# Patient Record
Sex: Male | Born: 1970 | Race: Black or African American | Hispanic: No | Marital: Married | Smoking: Current every day smoker
Health system: Southern US, Community
[De-identification: ages and names within clinical notes are randomized; demographics above are authoritative.]

## PROBLEM LIST (undated history)

## (undated) DIAGNOSIS — E785 Hyperlipidemia, unspecified: Secondary | ICD-10-CM

## (undated) DIAGNOSIS — E119 Type 2 diabetes mellitus without complications: Secondary | ICD-10-CM

## (undated) DIAGNOSIS — I10 Essential (primary) hypertension: Secondary | ICD-10-CM

## (undated) HISTORY — PX: OTHER SURGICAL HISTORY: SHX169

## (undated) HISTORY — DX: Type 2 diabetes mellitus without complications: E11.9

## (undated) HISTORY — DX: Essential (primary) hypertension: I10

## (undated) HISTORY — DX: Hyperlipidemia, unspecified: E78.5

---

## 2012-03-05 ENCOUNTER — Encounter (INDEPENDENT_AMBULATORY_CARE_PROVIDER_SITE_OTHER): Payer: Self-pay | Admitting: *Deleted

## 2012-03-18 ENCOUNTER — Ambulatory Visit (INDEPENDENT_AMBULATORY_CARE_PROVIDER_SITE_OTHER): Payer: 59 | Admitting: Internal Medicine

## 2012-03-18 ENCOUNTER — Telehealth (INDEPENDENT_AMBULATORY_CARE_PROVIDER_SITE_OTHER): Payer: Self-pay | Admitting: *Deleted

## 2012-03-18 ENCOUNTER — Other Ambulatory Visit (INDEPENDENT_AMBULATORY_CARE_PROVIDER_SITE_OTHER): Payer: Self-pay | Admitting: *Deleted

## 2012-03-18 ENCOUNTER — Encounter (INDEPENDENT_AMBULATORY_CARE_PROVIDER_SITE_OTHER): Payer: Self-pay | Admitting: Internal Medicine

## 2012-03-18 VITALS — BP 120/66 | HR 84 | Temp 97.8°F | Ht 65.0 in | Wt 169.8 lb

## 2012-03-18 DIAGNOSIS — K625 Hemorrhage of anus and rectum: Secondary | ICD-10-CM

## 2012-03-18 DIAGNOSIS — Z800 Family history of malignant neoplasm of digestive organs: Secondary | ICD-10-CM

## 2012-03-18 DIAGNOSIS — K921 Melena: Secondary | ICD-10-CM

## 2012-03-18 DIAGNOSIS — Z1211 Encounter for screening for malignant neoplasm of colon: Secondary | ICD-10-CM

## 2012-03-18 NOTE — Patient Instructions (Addendum)
Colonoscopy.  The risks and benefits such as perforation, bleeding, and infection were reviewed with the patient and is agreeable. 

## 2012-03-18 NOTE — Progress Notes (Signed)
Subjective:     Patient ID: Justin [name], male   DOB: 1971/01/22, 41 y.o.   MRN: [id number]  HPI Justin [name] is a 41 yr old male referred to our office for blood in his stool.  About 1 1/2 months ago he saw blood after he wiped.  The end of July he saw blood when he wiped. His stools were not hard when this occurred. Stools are light brown in color. No melena.  No pain with BMs.  He usually has 5-6 BMs a day which is normal for him. Appetite is good.  He has lost about 6-7 pounds after starting the new diabetic medication.      01/20/2012 H and H 14.8 and 44.0, MCV 87.1  Grandfather had colon cancer at age 92.  Is on another diabetic medications but he did not bring. Will call back medications.  Review of Systems see hpi Current Outpatient Prescriptions  Medication Sig Dispense Refill  . benazepril (LOTENSIN) 40 MG tablet Take 40 mg by mouth daily.      . metFORMIN (GLUMETZA) 1000 MG (MOD) 24 hr tablet Take 1,000 mg by mouth 2 (two) times daily with a meal.       Past Medical History  Diagnosis Date  . Hypertension     x 5 yrs  . Diabetes mellitus     Type 2 x 9 yrs.   History reviewed. No pertinent past surgical history.History reviewed. No pertinent past surgical history. History   Social History  . Marital Status: Married    Spouse Name: N/A    Number of Children: N/A  . Years of Education: N/A   Occupational History  . Not on file.   Social History Main Topics  . Smoking status: Current Everyday Smoker  . Smokeless tobacco: Not on file   Comment: Cigar a day  . Alcohol Use: Yes     6 pack a week  . Drug Use: No  . Sexually Active: Not on file   Other Topics Concern  . Not on file   Social History Narrative  . No narrative on file   Family Status  Relation Status Death Age  . Mother Alive     hypertension  . Father Alive     Diverticulitis  . Sister Alive     good health   Allergies  Allergen Reactions  . Codeine     Hives, itching            Objective:   Physical Exam Filed Vitals:   03/18/12 1527  Height: 5\' 5"  (1.651 m)  Weight: 169 lb 12.8 oz (77.021 kg)   Alert and oriented. Skin warm and dry. Oral mucosa is moist.   . Sclera anicteric, conjunctivae is pink. Thyroid not enlarged. No cervical lymphadenopathy. Lungs clear. Heart regular rate and rhythm.  Abdomen is soft. Bowel sounds are positive. Abdomen is soft but firm to the touch  . No abdominal masses felt. No tenderness.  No edema to lower extremities. Patient is alert and oriented.  Stool brown and guaiac positive.     Assessment:   Hx of rectal bleeding per patient.  Heme positive stool in office today. Colonic neoplasm, AVM, polyp needs to be ruled out. Family hx of colon cancer (Grandfather)    Plan:   Colonscopy.

## 2012-03-18 NOTE — Telephone Encounter (Signed)
Patient need movi prep 

## 2012-03-20 MED ORDER — PEG-KCL-NACL-NASULF-NA ASC-C 100 G PO SOLR: 1.0000 | Freq: Once | ORAL | Status: DC

## 2012-04-03 ENCOUNTER — Encounter (INDEPENDENT_AMBULATORY_CARE_PROVIDER_SITE_OTHER): Payer: Self-pay

## 2012-04-06 ENCOUNTER — Encounter (HOSPITAL_COMMUNITY): Payer: Self-pay | Admitting: Pharmacy Technician

## 2012-04-10 ENCOUNTER — Ambulatory Visit (HOSPITAL_COMMUNITY)
Admission: RE | Admit: 2012-04-10 | Discharge: 2012-04-10 | Disposition: A | Discharge status: Home / Self Care | Payer: 59 | Source: Ambulatory Visit | Attending: Internal Medicine | Admitting: Internal Medicine

## 2012-04-10 ENCOUNTER — Encounter (HOSPITAL_COMMUNITY): Payer: Self-pay | Admitting: *Deleted

## 2012-04-10 ENCOUNTER — Encounter (HOSPITAL_COMMUNITY)
Admission: RE | Disposition: A | Discharge status: Home / Self Care | Payer: Self-pay | Source: Ambulatory Visit | Attending: Internal Medicine

## 2012-04-10 DIAGNOSIS — K5730 Diverticulosis of large intestine without perforation or abscess without bleeding: Secondary | ICD-10-CM | POA: Insufficient documentation

## 2012-04-10 DIAGNOSIS — D126 Benign neoplasm of colon, unspecified: Secondary | ICD-10-CM | POA: Insufficient documentation

## 2012-04-10 DIAGNOSIS — K625 Hemorrhage of anus and rectum: Secondary | ICD-10-CM

## 2012-04-10 DIAGNOSIS — K644 Residual hemorrhoidal skin tags: Secondary | ICD-10-CM | POA: Insufficient documentation

## 2012-04-10 DIAGNOSIS — D128 Benign neoplasm of rectum: Secondary | ICD-10-CM

## 2012-04-10 DIAGNOSIS — Z01812 Encounter for preprocedural laboratory examination: Secondary | ICD-10-CM | POA: Insufficient documentation

## 2012-04-10 DIAGNOSIS — K921 Melena: Secondary | ICD-10-CM

## 2012-04-10 DIAGNOSIS — E119 Type 2 diabetes mellitus without complications: Secondary | ICD-10-CM | POA: Insufficient documentation

## 2012-04-10 DIAGNOSIS — K6389 Other specified diseases of intestine: Secondary | ICD-10-CM

## 2012-04-10 DIAGNOSIS — D129 Benign neoplasm of anus and anal canal: Secondary | ICD-10-CM

## 2012-04-10 DIAGNOSIS — Z800 Family history of malignant neoplasm of digestive organs: Secondary | ICD-10-CM | POA: Insufficient documentation

## 2012-04-10 DIAGNOSIS — R195 Other fecal abnormalities: Secondary | ICD-10-CM

## 2012-04-10 DIAGNOSIS — I10 Essential (primary) hypertension: Secondary | ICD-10-CM | POA: Insufficient documentation

## 2012-04-10 HISTORY — PX: COLONOSCOPY: SHX5424

## 2012-04-10 LAB — GLUCOSE, CAPILLARY: Glucose-Capillary: 157 mg/dL — ABNORMAL HIGH (ref 70–99)

## 2012-04-10 SURGERY — COLONOSCOPY
Anesthesia: Moderate Sedation

## 2012-04-10 MED ORDER — SODIUM CHLORIDE 0.45 % IV SOLN
INTRAVENOUS | Status: DC
  Administered 2012-04-10: 12:00:00 via INTRAVENOUS

## 2012-04-10 MED ORDER — MIDAZOLAM HCL 5 MG/5ML IJ SOLN
INTRAMUSCULAR | Status: DC | PRN
  Administered 2012-04-10 (×2): 2 mg via INTRAVENOUS
  Administered 2012-04-10: 1 mg via INTRAVENOUS

## 2012-04-10 MED ORDER — MIDAZOLAM HCL 5 MG/5ML IJ SOLN
INTRAMUSCULAR | Status: AC
  Filled 2012-04-10: qty 10

## 2012-04-10 MED ORDER — HYDROCORTISONE ACETATE 25 MG RE SUPP: 25.0000 mg | Freq: Every day | RECTAL | Status: AC

## 2012-04-10 MED ORDER — MEPERIDINE HCL 50 MG/ML IJ SOLN
INTRAMUSCULAR | Status: DC | PRN
  Administered 2012-04-10 (×2): 25 mg via INTRAVENOUS

## 2012-04-10 MED ORDER — MEPERIDINE HCL 50 MG/ML IJ SOLN
INTRAMUSCULAR | Status: AC
  Filled 2012-04-10: qty 1

## 2012-04-10 MED ORDER — STERILE WATER FOR IRRIGATION IR SOLN
Status: DC | PRN
  Administered 2012-04-10: 12:00:00

## 2012-04-10 NOTE — Op Note (Signed)
COLONOSCOPY PROCEDURE REPORT  PATIENT:  Justin [name]  MR#:  [id number] Birthdate:  Apr 25, 1971, 41 y.o., male Endoscopist:  Dr. Malissa Hippo, MD Referred By:  Dr. Oley Balm. Margo Common, MD Procedure Date: 04/10/2012  Procedure:   Colonoscopy  Indications:  Patient is 41 year old African American male with intermittent hematochezia was also noted to have heme positive stools. He denies diarrhea constipation or abdominal pain. Family history is significant for colon carcinoma and maternal grandmother at age 26 and father had colonic polyps.  Informed Consent:  The procedure and risks were reviewed with the patient and informed consent was obtained.  Medications:  Demerol 50 mg IV Versed 5 mg IV  Description of procedure:  After a digital rectal exam was performed, that colonoscope was advanced from the anus through the rectum and colon to the area of the cecum, ileocecal valve and appendiceal orifice. The cecum was deeply intubated. These structures were well-seen and photographed for the record. From the level of the cecum and ileocecal valve, the scope was slowly and cautiously withdrawn. The mucosal surfaces were carefully surveyed utilizing scope tip to flexion to facilitate fold flattening as needed. The scope was pulled down into the rectum where a thorough exam including retroflexion was performed. Terminal ileum was also examined.  Findings:   Prep excellent. Normal terminal ileal mucosa. Small cecal polyp ablated via cold biopsy. Another small rectal polyp ablated via cold biopsy. Few small diverticula at sigmoid colon. Anal papillae and hemorrhoids below the dentate line.  Therapeutic/Diagnostic Maneuvers Performed:  See above  Complications:  None  Cecal Withdrawal Time:  16 minutes  Impression:  Normal terminal ileum. Mild sigmoid colon diverticulosis. 2 small polyps ablated via cold biopsied 1 from cecum and the second one from rectum. External hemorrhoids and small anal  papillae.  Recommendations:  Standard instructions given. Anusol-HC suppository 1 PR daily at bedtime for 2 weeks. I will contact patient with biopsy results and further recommendations.  Justin [name]  04/10/2012 12:43 PM  CC: Dr. Louie Boston, MD & Dr. Bonnetta Barry ref. provider found

## 2012-04-10 NOTE — H&P (Signed)
Justin [name] is an 41 y.o. male.   Chief Complaint: Patient is here for colonoscopy. HPI: Patient is 41 year old African male who has noted intermittent hematochezia the last 2 months. He denies abdominal pain diarrhea or constipation. He has a good appetite. His hemoglobin is normal. He was seen in the office 2 weeks ago and his stool was heme positive. Family history is positive for colon carcinoma in maternal grandmother was diagnosed at age 32 and died within a year. His father has had colonic polyps and had surgery for diverticulitis  Past Medical History  Diagnosis Date  . Hypertension     x 5 yrs  . Diabetes mellitus     Type 2 x 9 yrs.    Past Surgical History  Procedure Date  .  rt knee arthroscopy     History reviewed. No pertinent family history. Social History:  reports that he has been smoking.  He does not have any smokeless tobacco history on file. He reports that he drinks alcohol. He reports that he does not use illicit drugs.  Allergies:  Allergies  Allergen Reactions  . Codeine     Hives, itching     Medications Prior to Admission  Medication Sig Dispense Refill  . benazepril (LOTENSIN) 20 MG tablet Take 20 mg by mouth 2 (two) times daily.      . Canagliflozin (INVOKANA) 100 MG TABS Take 100 mg by mouth daily.       . metformin (FORTAMET) 500 MG (OSM) 24 hr tablet Take 500 mg by mouth 3 (three) times daily.      . peg 3350 powder (MOVIPREP) 100 G SOLR Take 1 kit (100 g total) by mouth once.  1 kit  0    Results for orders placed during the hospital encounter of 04/10/12 (from the past 48 hour(s))  GLUCOSE, CAPILLARY     Status: Abnormal   Collection Time   04/10/12 11:46 AM      Component Value Range Comment   Glucose-Capillary 157 (*) 70 - 99 mg/dL    No results found.  ROS  Blood pressure 134/91, pulse 87, temperature 98 F (36.7 C), resp. rate 18, height 5\' 5"  (1.651 m), weight 158 lb (71.668 kg), SpO2 95.00%. Physical Exam  Constitutional: He  appears well-developed and well-nourished.  HENT:  Mouth/Throat: Oropharynx is clear and moist.  Eyes: Conjunctivae normal are normal. No scleral icterus.  Neck: No thyromegaly present.  Cardiovascular: Normal rate, regular rhythm and normal heart sounds.   No murmur heard. Respiratory: Effort normal and breath sounds normal.  GI: Soft. Bowel sounds are normal. He exhibits no distension and no mass. There is no tenderness.  Musculoskeletal: He exhibits no edema.  Lymphadenopathy:    He has no cervical adenopathy.  Neurological: He is alert.  Skin: Skin is warm and dry.     Assessment/Plan Hematochezia and heme-positive stools. Diagnostic colonoscopy.  Justin,NAJEEB [name] 04/10/2012, 12:08 PM

## 2012-04-15 ENCOUNTER — Encounter (HOSPITAL_COMMUNITY): Payer: Self-pay | Admitting: Internal Medicine

## 2012-04-20 ENCOUNTER — Encounter (INDEPENDENT_AMBULATORY_CARE_PROVIDER_SITE_OTHER): Payer: Self-pay | Admitting: *Deleted

## 2015-05-04 ENCOUNTER — Other Ambulatory Visit: Payer: Self-pay

## 2015-05-04 MED ORDER — METFORMIN HCL 1000 MG PO TABS: 1000.0000 mg | ORAL_TABLET | Freq: Two times a day (BID) | ORAL | Status: DC

## 2015-06-07 ENCOUNTER — Ambulatory Visit (INDEPENDENT_AMBULATORY_CARE_PROVIDER_SITE_OTHER): Payer: 59 | Admitting: "Endocrinology

## 2015-06-07 ENCOUNTER — Encounter: Payer: Self-pay | Admitting: "Endocrinology

## 2015-06-07 VITALS — BP 139/85 | HR 95 | Ht 65.0 in | Wt 158.0 lb

## 2015-06-07 DIAGNOSIS — E559 Vitamin D deficiency, unspecified: Secondary | ICD-10-CM | POA: Diagnosis not present

## 2015-06-07 DIAGNOSIS — E7849 Other hyperlipidemia: Secondary | ICD-10-CM | POA: Insufficient documentation

## 2015-06-07 DIAGNOSIS — I10 Essential (primary) hypertension: Secondary | ICD-10-CM | POA: Diagnosis not present

## 2015-06-07 DIAGNOSIS — E118 Type 2 diabetes mellitus with unspecified complications: Secondary | ICD-10-CM | POA: Diagnosis not present

## 2015-06-07 DIAGNOSIS — E1169 Type 2 diabetes mellitus with other specified complication: Secondary | ICD-10-CM | POA: Insufficient documentation

## 2015-06-07 DIAGNOSIS — E785 Hyperlipidemia, unspecified: Secondary | ICD-10-CM | POA: Diagnosis not present

## 2015-06-07 DIAGNOSIS — Z794 Long term (current) use of insulin: Secondary | ICD-10-CM | POA: Diagnosis not present

## 2015-06-07 DIAGNOSIS — IMO0002 Reserved for concepts with insufficient information to code with codable children: Secondary | ICD-10-CM

## 2015-06-07 DIAGNOSIS — E1165 Type 2 diabetes mellitus with hyperglycemia: Secondary | ICD-10-CM | POA: Diagnosis not present

## 2015-06-07 DIAGNOSIS — I152 Hypertension secondary to endocrine disorders: Secondary | ICD-10-CM | POA: Insufficient documentation

## 2015-06-07 DIAGNOSIS — E1159 Type 2 diabetes mellitus with other circulatory complications: Secondary | ICD-10-CM | POA: Insufficient documentation

## 2015-06-07 MED ORDER — VITAMIN D (ERGOCALCIFEROL) 1.25 MG (50000 UNIT) PO CAPS: 50000.0000 [IU] | ORAL_CAPSULE | ORAL | Status: DC

## 2015-06-07 NOTE — Progress Notes (Signed)
Subjective:    Patient ID: Justin [name], male    DOB: [birth year],    Past Medical History  Diagnosis Date  . Hypertension     x 5 yrs  . Diabetes mellitus ([place])     Type 2 x 9 yrs.  . Hyperlipidemia    Past Surgical History  Procedure Laterality Date  .  rt knee arthroscopy    . Colonoscopy  04/10/2012    Procedure: COLONOSCOPY;  Surgeon: Rogene Houston, MD;  Location: [facility];  Service: Endoscopy;  Laterality: N/A;  1200   Social History   Social History  . Marital Status: Married    Spouse Name: N/A  . Number of Children: N/A  . Years of Education: N/A   Social History Main Topics  . Smoking status: Current Every Day Smoker  . Smokeless tobacco: None     Comment: Cigar a day  . Alcohol Use: Yes     Comment: 6 pack a week  . Drug Use: No  . Sexual Activity: Not Asked   Other Topics Concern  . None   Social History Narrative   Outpatient Encounter Prescriptions as of 06/07/2015  Medication Sig  . metFORMIN (GLUCOPHAGE) 1000 MG tablet Take 1 tablet (1,000 mg total) by mouth 2 (two) times daily with a meal.  . [DISCONTINUED] benazepril (LOTENSIN) 20 MG tablet Take 20 mg by mouth 2 (two) times daily.  . [DISCONTINUED] Canagliflozin (INVOKANA) 100 MG TABS Take 100 mg by mouth daily.    No facility-administered encounter medications on file as of 06/07/2015.   ALLERGIES: Allergies  Allergen Reactions  . Codeine     Hives, itching    VACCINATION STATUS:  There is no immunization history on file for this patient.  Diabetes He presents for his follow-up diabetic visit. He has type 2 diabetes mellitus. Onset time: He was diagnosed at approximate age of 47 years. His disease course has been improving. There are no hypoglycemic associated symptoms. Pertinent negatives for hypoglycemia include no confusion, headaches, pallor or seizures. There are no diabetic associated symptoms. Pertinent negatives for diabetes include no fatigue, no polydipsia, no  polyphagia, no polyuria and no weakness. There are no hypoglycemic complications. Symptoms are stable. There are no diabetic complications. Risk factors for coronary artery disease include diabetes mellitus, dyslipidemia, hypertension and male sex. Current diabetic treatment includes insulin injections and oral agent (dual therapy). He is compliant with treatment most of the time. His weight is stable. He is following a generally unhealthy diet. He participates in exercise intermittently. His home blood glucose trend is decreasing steadily. His overall blood glucose range is 140-180 mg/dl. An ACE inhibitor/angiotensin II receptor blocker is being taken. Eye exam is not current.  Hypertension This is a chronic problem. The current episode started more than 1 year ago. Pertinent negatives include no headaches, neck pain or palpitations. Past treatments include ACE inhibitors. The current treatment provides moderate improvement. Compliance problems include diet.   Hyperlipidemia This is a chronic problem. The current episode started more than 1 year ago. Treatments tried: He was supposed to be on atorvastatin, however for unclear reasons patient stopped his medication. Risk factors for coronary artery disease include dyslipidemia, diabetes mellitus, hypertension and male sex.     Review of Systems  Constitutional: Negative for fatigue and unexpected weight change.  HENT: Negative for dental problem, mouth sores and trouble swallowing.   Eyes: Negative for visual disturbance.  Respiratory: Negative for cough, choking, chest tightness and  wheezing.   Cardiovascular: Negative for palpitations and leg swelling.  Gastrointestinal: Negative for nausea, vomiting, abdominal pain, diarrhea, constipation and abdominal distention.  Endocrine: Negative for polydipsia, polyphagia and polyuria.  Genitourinary: Negative for dysuria, urgency, hematuria and flank pain.  Musculoskeletal: Negative for back pain, gait  problem and neck pain.  Skin: Negative for pallor, rash and wound.  Neurological: Negative for seizures, syncope, weakness, numbness and headaches.  Psychiatric/Behavioral: Negative.  Negative for confusion and dysphoric mood.     Objective:    BP 139/85 mmHg  Pulse 95  Ht 5\' 5"  (1.651 m)  Wt 158 lb (71.668 kg)  BMI 26.29 kg/m2  SpO2 98%  Wt Readings from Last 3 Encounters:  06/07/15 158 lb (71.668 kg)  04/10/12 158 lb (71.668 kg)  03/18/12 169 lb 12.8 oz (77.021 kg)    Physical Exam  Constitutional: He is oriented to person, place, and time. He appears well-developed and well-nourished. He is cooperative. No distress.  HENT:  Head: Normocephalic and atraumatic.  Eyes: EOM are normal.  Neck: Normal range of motion. Neck supple. No tracheal deviation present. No thyromegaly present.  Cardiovascular: Normal rate, S1 normal, S2 normal and normal heart sounds.  Exam reveals no gallop.   No murmur heard. Pulses:      Dorsalis pedis pulses are 1+ on the right side, and 1+ on the left side.       Posterior tibial pulses are 1+ on the right side, and 1+ on the left side.  Pulmonary/Chest: Breath sounds normal. No respiratory distress. He has no wheezes.  Abdominal: Soft. Bowel sounds are normal. He exhibits no distension. There is no tenderness. There is no guarding and no CVA tenderness.  Musculoskeletal: He exhibits no edema.       Right shoulder: He exhibits no swelling and no deformity.  Neurological: He is alert and oriented to person, place, and time. He has normal strength and normal reflexes. No cranial nerve deficit or sensory deficit. Gait normal.  Skin: Skin is warm and dry. No rash noted. No cyanosis. Nails show no clubbing.  Psychiatric: He has a normal mood and affect. His speech is normal and behavior is normal. Judgment and thought content normal. Cognition and memory are normal.    Results for orders placed or performed during the hospital encounter of 04/10/12   Glucose, capillary  Result Value Ref Range   Glucose-Capillary 157 (H) 70 - 99 mg/dL       Assessment & Plan:   1. Uncontrolled type 2 diabetes mellitus with complication, with long-term current use of insulin ([place])   He  remains at a high risk for more acute and chronic complications of diabetes which include CAD, CVA, CKD, retinopathy, and neuropathy. These are all discussed in detail with the patient.  Patient came with improved glucose profile, and  recent A1c of 8 %.  Glucose logs and insulin administration records pertaining to this visit,  to be scanned into patient's records.  Recent labs reviewed.   - I have re-counseled the patient on diet management  by adopting a carbohydrate restricted / protein rich  Diet.  - Suggestion is made for patient to avoid simple carbohydrates   from their diet including Cakes , Desserts, Ice Cream,  Soda (  diet and regular) , Sweet Tea , Candies,  Chips, Cookies, Artificial Sweeteners,   and "Sugar-free" Products .  This will help patient to have stable blood glucose profile and potentially avoid unintended  Weight gain.  -  Patient is advised to stick to a routine mealtimes to eat 3 meals  a day and avoid unnecessary snacks ( to snack only to correct hypoglycemia).  - The patient  has been  scheduled with Jearld Fenton, RDN, CDE for individualized DM education.  - I have approached patient with the following individualized plan to manage diabetes and patient agrees.  - Emphasizing the importance of compliance, I will proceed to readjust patient's medications as follows: - I will continue lantus at 30 units qhs, associated with monitoring of BG qam.  - He will continue MTF 1000mg  po BID, and Invokana 300mg  po qday.  - Patient will be considered for incretin therapy as appropriate next visit. - Patient specific target  for A1c; LDL, HDL, Triglycerides, and  Waist Circumference were discussed in detail.  2) BP/HTN: Controlled. Continue  current medications including ACEI/ARB. 3) Lipids/HPL: For  unclear reasons he discontinued his atorvastatin. I will approach him after repeat fasting lipid panel. 4)  Weight/Diet: CDE consult in progress, exercise, and carbohydrates information provided.  5) Vitamin D deficiency  This is a recurring problem. I will retreat with vitamin D 50,000 units weekly for the next 12 weeks.  6) Chronic Care/Health Maintenance:  -Patient is on ACEI/ARB  medications and encouraged to continue to follow up with Ophthalmology, Podiatrist at least yearly or according to recommendations, and advised to  stay away from smoking. I have recommended yearly flu vaccine and pneumonia vaccination at least every 5 years; moderate intensity exercise for up to 150 minutes weekly; and  sleep for at least 7 hours a day.  I advised patient to maintain close follow up with their PCP for primary care needs.  Patient is asked to bring meter and  blood glucose logs during their next visit.   Follow up plan: No Follow-up on file.  Glade Lloyd, MD Phone: [phone]  Fax: [phone]   06/07/2015, 8:44 AM

## 2015-06-07 NOTE — Patient Instructions (Signed)

## 2015-06-12 ENCOUNTER — Other Ambulatory Visit: Payer: Self-pay

## 2015-06-12 MED ORDER — BENAZEPRIL HCL 40 MG PO TABS: 40.0000 mg | ORAL_TABLET | Freq: Every day | ORAL | Status: DC

## 2015-06-27 ENCOUNTER — Other Ambulatory Visit: Payer: Self-pay | Admitting: "Endocrinology

## 2015-06-27 ENCOUNTER — Telehealth: Payer: Self-pay

## 2015-06-27 NOTE — Telephone Encounter (Signed)
Pts insurance will not cover [place]. Wilder Glade or jardiance are preferred.

## 2015-06-28 ENCOUNTER — Other Ambulatory Visit: Payer: Self-pay | Admitting: "Endocrinology

## 2015-06-28 MED ORDER — EMPAGLIFLOZIN 10 MG PO TABS: 10.0000 mg | ORAL_TABLET | Freq: Every day | ORAL | Status: DC

## 2015-06-28 NOTE — Telephone Encounter (Signed)
I will change it to Jardiance.

## 2015-06-30 NOTE — Telephone Encounter (Signed)
Called pt twice. Get a fast busy signal each time. Prescription sent by Dr Dorris Fetch.

## 2015-08-18 ENCOUNTER — Other Ambulatory Visit: Payer: Self-pay

## 2015-08-18 ENCOUNTER — Other Ambulatory Visit: Payer: Self-pay | Admitting: "Endocrinology

## 2015-08-18 MED ORDER — INSULIN DETEMIR 100 UNIT/ML FLEXPEN: 30.0000 [IU] | PEN_INJECTOR | Freq: Every day | SUBCUTANEOUS | Status: DC

## 2015-08-18 MED FILL — LEVEMIR FLEXTOUCH 100 UNITS: 100 | 30 days supply | Qty: 15 | Fill #0

## 2015-08-18 MED FILL — metFORMIN HCL 1000 MG TABS: 1000 | 30 days supply | Qty: 60 | Fill #0

## 2015-08-18 MED FILL — BENAZEPRIL HCL 40 MG TABLET: 40 | 30 days supply | Qty: 30 | Fill #2

## 2015-08-18 MED FILL — JARDIANCE 10 MG TABLET: 10 | 30 days supply | Qty: 30 | Fill #1

## 2015-09-08 ENCOUNTER — Ambulatory Visit: Payer: 59 | Admitting: "Endocrinology

## 2015-09-26 MED FILL — JARDIANCE 10 MG TABLET: 10 | 30 days supply | Qty: 30 | Fill #2

## 2015-09-26 MED FILL — metFORMIN HCL 1000 MG TABS: 1000 | 30 days supply | Qty: 60 | Fill #1

## 2015-10-02 ENCOUNTER — Other Ambulatory Visit: Payer: Self-pay | Admitting: "Endocrinology

## 2015-10-02 LAB — LIPID PANEL
Cholesterol: 251 mg/dL — ABNORMAL HIGH (ref 125–200)
HDL: 43 mg/dL (ref >=40)
Total CHOL/HDL Ratio: 5.8 Ratio — ABNORMAL HIGH (ref <=5.0)
Triglycerides: 411 mg/dL — ABNORMAL HIGH (ref <150)

## 2015-10-02 LAB — BASIC METABOLIC PANEL
BUN: 12 mg/dL (ref 7–25)
CALCIUM: 9.9 mg/dL (ref 8.6–10.3)
CO2: 24 mmol/L (ref 20–31)
Chloride: 101 mmol/L (ref 98–110)
Creat: 1 mg/dL (ref 0.60–1.35)
Glucose, Bld: 239 mg/dL — ABNORMAL HIGH (ref 65–99)
POTASSIUM: 4.6 mmol/L (ref 3.5–5.3)
SODIUM: 137 mmol/L (ref 135–146)

## 2015-10-02 LAB — HEMOGLOBIN A1C
HEMOGLOBIN A1C: 8.1 % — AB (ref <5.7)
Mean Plasma Glucose: 186 mg/dL — ABNORMAL HIGH (ref <117)

## 2015-10-10 ENCOUNTER — Ambulatory Visit (INDEPENDENT_AMBULATORY_CARE_PROVIDER_SITE_OTHER): Payer: 59 | Admitting: "Endocrinology

## 2015-10-10 ENCOUNTER — Encounter: Payer: Self-pay | Admitting: "Endocrinology

## 2015-10-10 VITALS — BP 132/87 | HR 104 | Ht 65.0 in | Wt 161.0 lb

## 2015-10-10 DIAGNOSIS — I10 Essential (primary) hypertension: Secondary | ICD-10-CM | POA: Diagnosis not present

## 2015-10-10 DIAGNOSIS — IMO0002 Reserved for concepts with insufficient information to code with codable children: Secondary | ICD-10-CM

## 2015-10-10 DIAGNOSIS — E118 Type 2 diabetes mellitus with unspecified complications: Secondary | ICD-10-CM

## 2015-10-10 DIAGNOSIS — E1165 Type 2 diabetes mellitus with hyperglycemia: Secondary | ICD-10-CM | POA: Diagnosis not present

## 2015-10-10 DIAGNOSIS — E559 Vitamin D deficiency, unspecified: Secondary | ICD-10-CM | POA: Diagnosis not present

## 2015-10-10 DIAGNOSIS — E785 Hyperlipidemia, unspecified: Secondary | ICD-10-CM | POA: Diagnosis not present

## 2015-10-10 DIAGNOSIS — Z794 Long term (current) use of insulin: Secondary | ICD-10-CM

## 2015-10-10 MED ORDER — INSULIN DETEMIR 100 UNIT/ML FLEXPEN: 30.0000 [IU] | PEN_INJECTOR | Freq: Every day | SUBCUTANEOUS | Status: DC

## 2015-10-10 MED FILL — LEVEMIR FLEXTOUCH 100 UNITS: 100 | 30 days supply | Qty: 9 | Fill #0

## 2015-10-10 NOTE — Patient Instructions (Signed)

## 2015-10-10 NOTE — Progress Notes (Signed)
Subjective:    Patient ID: Justin [name], male    DOB: [birth year],    Past Medical History  Diagnosis Date  . Hypertension     x 5 yrs  . Diabetes mellitus ([place])     Type 2 x 9 yrs.  . Hyperlipidemia    Past Surgical History  Procedure Laterality Date  .  rt knee arthroscopy    . Colonoscopy  04/10/2012    Procedure: COLONOSCOPY;  Surgeon: Rogene Houston, MD;  Location: [facility];  Service: Endoscopy;  Laterality: N/A;  1200   Social History   Social History  . Marital Status: Married    Spouse Name: N/A  . Number of Children: N/A  . Years of Education: N/A   Social History Main Topics  . Smoking status: Current Every Day Smoker  . Smokeless tobacco: None     Comment: Cigar a day  . Alcohol Use: Yes     Comment: 6 pack a week  . Drug Use: No  . Sexual Activity: Not Asked   Other Topics Concern  . None   Social History Narrative   Outpatient Encounter Prescriptions as of 10/10/2015  Medication Sig  . benazepril (LOTENSIN) 40 MG tablet Take 1 tablet (40 mg total) by mouth daily.  . empagliflozin (JARDIANCE) 10 MG TABS tablet Take 10 mg by mouth daily.  . Insulin Detemir (LEVEMIR FLEXTOUCH) 100 UNIT/ML Pen Inject 30 Units into the skin at bedtime.  . metFORMIN (GLUCOPHAGE) 1000 MG tablet TAKE 1 TABLET BY MOUTH TWICE DAILY WITH MEALS  . Vitamin D, Ergocalciferol, (DRISDOL) 50000 UNITS CAPS capsule Take 1 capsule (50,000 Units total) by mouth every 7 (seven) days.  . [DISCONTINUED] Insulin Detemir (LEVEMIR FLEXTOUCH) 100 UNIT/ML Pen Inject 30 Units into the skin at bedtime.   No facility-administered encounter medications on file as of 10/10/2015.   ALLERGIES: Allergies  Allergen Reactions  . Codeine     Hives, itching    VACCINATION STATUS:  There is no immunization history on file for this patient.  Diabetes He presents for his follow-up diabetic visit. He has type 2 diabetes mellitus. Onset time: He was diagnosed at approximate age of 73 years. His  disease course has been stable. There are no hypoglycemic associated symptoms. Pertinent negatives for hypoglycemia include no confusion, headaches, pallor or seizures. There are no diabetic associated symptoms. Pertinent negatives for diabetes include no fatigue, no polydipsia, no polyphagia, no polyuria and no weakness. There are no hypoglycemic complications. Symptoms are stable. There are no diabetic complications. Risk factors for coronary artery disease include diabetes mellitus, dyslipidemia, hypertension and male sex. Current diabetic treatment includes insulin injections and oral agent (dual therapy). He is compliant with treatment most of the time. His weight is increasing steadily. He is following a generally unhealthy diet. He participates in exercise intermittently. His home blood glucose trend is decreasing steadily. His breakfast blood glucose range is generally 140-180 mg/dl. His overall blood glucose range is 140-180 mg/dl. An ACE inhibitor/angiotensin II receptor blocker is being taken. Eye exam is not current.  Hypertension This is a chronic problem. The current episode started more than 1 year ago. Pertinent negatives include no headaches, neck pain or palpitations. Past treatments include ACE inhibitors. The current treatment provides moderate improvement. Compliance problems include diet.   Hyperlipidemia This is a chronic problem. The current episode started more than 1 year ago. Treatments tried: He was supposed to be on atorvastatin, however for unclear reasons patient stopped  his medication. Risk factors for coronary artery disease include dyslipidemia, diabetes mellitus, hypertension and male sex.     Review of Systems  Constitutional: Negative for fatigue and unexpected weight change.  HENT: Negative for dental problem, mouth sores and trouble swallowing.   Eyes: Negative for visual disturbance.  Respiratory: Negative for cough, choking, chest tightness and wheezing.    Cardiovascular: Negative for palpitations and leg swelling.  Gastrointestinal: Negative for nausea, vomiting, abdominal pain, diarrhea, constipation and abdominal distention.  Endocrine: Negative for polydipsia, polyphagia and polyuria.  Genitourinary: Negative for dysuria, urgency, hematuria and flank pain.  Musculoskeletal: Negative for back pain, gait problem and neck pain.  Skin: Negative for pallor, rash and wound.  Neurological: Negative for seizures, syncope, weakness, numbness and headaches.  Psychiatric/Behavioral: Negative.  Negative for confusion and dysphoric mood.     Objective:    BP 132/87 mmHg  Pulse 104  Ht 5\' 5"  (1.651 m)  Wt 161 lb (73.029 kg)  BMI 26.79 kg/m2  SpO2 96%  Wt Readings from Last 3 Encounters:  10/10/15 161 lb (73.029 kg)  06/07/15 158 lb (71.668 kg)  04/10/12 158 lb (71.668 kg)    Physical Exam  Constitutional: He is oriented to person, place, and time. He appears well-developed and well-nourished. He is cooperative. No distress.  HENT:  Head: Normocephalic and atraumatic.  Eyes: EOM are normal.  Neck: Normal range of motion. Neck supple. No tracheal deviation present. No thyromegaly present.  Cardiovascular: Normal rate, S1 normal, S2 normal and normal heart sounds.  Exam reveals no gallop.   No murmur heard. Pulses:      Dorsalis pedis pulses are 1+ on the right side, and 1+ on the left side.       Posterior tibial pulses are 1+ on the right side, and 1+ on the left side.  Pulmonary/Chest: Breath sounds normal. No respiratory distress. He has no wheezes.  Abdominal: Soft. Bowel sounds are normal. He exhibits no distension. There is no tenderness. There is no guarding and no CVA tenderness.  Musculoskeletal: He exhibits no edema.       Right shoulder: He exhibits no swelling and no deformity.  Neurological: He is alert and oriented to person, place, and time. He has normal strength and normal reflexes. No cranial nerve deficit or sensory  deficit. Gait normal.  Skin: Skin is warm and dry. No rash noted. No cyanosis. Nails show no clubbing.  Psychiatric: He has a normal mood and affect. His speech is normal and behavior is normal. Judgment and thought content normal. Cognition and memory are normal.    Results for orders placed or performed in visit on [id number]  Basic metabolic panel  Result Value Ref Range   Sodium 137 135 - 146 mmol/L   Potassium 4.6 3.5 - 5.3 mmol/L   Chloride 101 98 - 110 mmol/L   CO2 24 20 - 31 mmol/L   Glucose, Bld 239 (H) 65 - 99 mg/dL   BUN 12 7 - 25 mg/dL   Creat 1.00 0.60 - 1.35 mg/dL   Calcium 9.9 8.6 - 10.3 mg/dL  Hemoglobin A1c  Result Value Ref Range   Hgb A1c MFr Bld 8.1 (H) <5.7 %   Mean Plasma Glucose 186 (H) <117 mg/dL       Assessment & Plan:   1. Uncontrolled type 2 diabetes mellitus with complication, with long-term current use of insulin ([place])   He  remains at a high risk for more acute and chronic complications of diabetes  which include CAD, CVA, CKD, retinopathy, and neuropathy. These are all discussed in detail with the patient.  Patient came with improved glucose profile, and  recent A1c of 8 .1%, Stable.  Glucose logs and insulin administration records pertaining to this visit,  to be scanned into patient's records.  Recent labs reviewed.   - I have re-counseled the patient on diet management  by adopting a carbohydrate restricted / protein rich  Diet.  - Suggestion is made for patient to avoid simple carbohydrates   from their diet including Cakes , Desserts, Ice Cream,  Soda (  diet and regular) , Sweet Tea , Candies,  Chips, Cookies, Artificial Sweeteners,   and "Sugar-free" Products .  This will help patient to have stable blood glucose profile and potentially avoid unintended  Weight gain.  - Patient is advised to stick to a routine mealtimes to eat 3 meals  a day and avoid unnecessary snacks ( to snack only to correct hypoglycemia).  - The patient  has been   scheduled with Jearld Fenton, RDN, CDE for individualized DM education.  - I have approached patient with the following individualized plan to manage diabetes and patient agrees.  - Emphasizing the importance of compliance, I will proceed to readjust patient's medications as follows: - I will increase Levemir to 40  units qhs, associated with monitoring of BG qam.  - He will continue MTF 1000mg  po BID, and Jardiance 10mg  po qday.  - Patient will be considered for incretin therapy as appropriate next visit. - Patient specific target  for A1c; LDL, HDL, Triglycerides, and  Waist Circumference were discussed in detail.  2) BP/HTN: Controlled. Continue current medications including ACEI/ARB. 3) Lipids/HPL: For  unclear reasons he discontinued his atorvastatin. I will approach him after repeat fasting lipid panel. 4)  Weight/Diet: CDE consult in progress, exercise, and carbohydrates information provided.  5) Vitamin D deficiency This is a recurring problem. I will retreat with vitamin D 50,000 units weekly for the next 12 weeks.  6) Chronic Care/Health Maintenance:  -Patient is on ACEI/ARB  medications and encouraged to continue to follow up with Ophthalmology, Podiatrist at least yearly or according to recommendations, and advised to  stay away from smoking. I have recommended yearly flu vaccine and pneumonia vaccination at least every 5 years; moderate intensity exercise for up to 150 minutes weekly; and  sleep for at least 7 hours a day.  I advised patient to maintain close follow up with their PCP for primary care needs.  Patient is asked to bring meter and  blood glucose logs during their next visit.   Follow up plan: Return in about 3 months (around 01/10/2016) for diabetes, high blood pressure, high cholesterol, follow up with pre-visit labs, meter, and logs.  Glade Lloyd, MD Phone: [phone]  Fax: [phone]   10/10/2015, 8:47 AM

## 2015-10-16 ENCOUNTER — Other Ambulatory Visit: Payer: Self-pay | Admitting: "Endocrinology

## 2015-10-16 MED FILL — BD PEN NDL NANO 32GX5/32: 32G X 4 MM | 30 days supply | Qty: 100 | Fill #3

## 2015-10-17 MED FILL — BENAZEPRIL HCL 40 MG TABLET: 40 | 30 days supply | Qty: 30 | Fill #0

## 2015-10-30 MED FILL — JARDIANCE 10 MG TABLET: 10 | 30 days supply | Qty: 30 | Fill #3

## 2015-11-13 MED FILL — metFORMIN HCL 1000 MG TABS: 1000 | 30 days supply | Qty: 60 | Fill #2

## 2015-11-13 MED FILL — LEVEMIR FLEXTOUCH 100 UNITS: 100 | 30 days supply | Qty: 9 | Fill #1

## 2015-11-29 ENCOUNTER — Other Ambulatory Visit: Payer: Self-pay | Admitting: "Endocrinology

## 2015-11-29 MED FILL — JARDIANCE 10 MG TABLET: 10 | 30 days supply | Qty: 30 | Fill #0

## 2015-12-19 MED FILL — LEVEMIR FLEXTOUCH 100 UNITS: 100 | 30 days supply | Qty: 9 | Fill #2

## 2015-12-19 MED FILL — BENAZEPRIL HCL 40 MG TABLET: 40 | 30 days supply | Qty: 30 | Fill #1

## 2015-12-21 ENCOUNTER — Other Ambulatory Visit: Payer: Self-pay | Admitting: "Endocrinology

## 2015-12-21 MED FILL — metFORMIN HCL 1000 MG TABS: 1000 | 30 days supply | Qty: 60 | Fill #0

## 2015-12-27 MED FILL — JARDIANCE 10 MG TABLET: 10 | 30 days supply | Qty: 30 | Fill #1

## 2016-01-15 ENCOUNTER — Other Ambulatory Visit: Payer: Self-pay | Admitting: "Endocrinology

## 2016-01-15 LAB — BASIC METABOLIC PANEL
BUN: 12 mg/dL (ref 7–25)
CALCIUM: 9.1 mg/dL (ref 8.6–10.3)
CHLORIDE: 104 mmol/L (ref 98–110)
CO2: 22 mmol/L (ref 20–31)
CREATININE: 0.62 mg/dL (ref 0.60–1.35)
GLUCOSE: 156 mg/dL — AB (ref 65–99)
Potassium: 4 mmol/L (ref 3.5–5.3)
Sodium: 139 mmol/L (ref 135–146)

## 2016-01-15 LAB — HEMOGLOBIN A1C
Hgb A1c MFr Bld: 8.3 % — ABNORMAL HIGH (ref <5.7)
Mean Plasma Glucose: 192 mg/dL

## 2016-01-17 ENCOUNTER — Ambulatory Visit: Payer: 59 | Admitting: "Endocrinology

## 2016-01-22 ENCOUNTER — Encounter: Payer: Self-pay | Admitting: "Endocrinology

## 2016-01-22 ENCOUNTER — Ambulatory Visit (INDEPENDENT_AMBULATORY_CARE_PROVIDER_SITE_OTHER): Payer: 59 | Admitting: "Endocrinology

## 2016-01-22 VITALS — BP 137/84 | HR 96 | Ht 65.0 in | Wt 160.0 lb

## 2016-01-22 DIAGNOSIS — IMO0002 Reserved for concepts with insufficient information to code with codable children: Secondary | ICD-10-CM

## 2016-01-22 DIAGNOSIS — I10 Essential (primary) hypertension: Secondary | ICD-10-CM

## 2016-01-22 DIAGNOSIS — E118 Type 2 diabetes mellitus with unspecified complications: Secondary | ICD-10-CM

## 2016-01-22 DIAGNOSIS — E559 Vitamin D deficiency, unspecified: Secondary | ICD-10-CM

## 2016-01-22 DIAGNOSIS — E785 Hyperlipidemia, unspecified: Secondary | ICD-10-CM | POA: Diagnosis not present

## 2016-01-22 DIAGNOSIS — Z794 Long term (current) use of insulin: Secondary | ICD-10-CM | POA: Diagnosis not present

## 2016-01-22 DIAGNOSIS — E1165 Type 2 diabetes mellitus with hyperglycemia: Secondary | ICD-10-CM | POA: Diagnosis not present

## 2016-01-22 MED FILL — LEVEMIR FLEXTOUCH 100 UNITS: 100 | 30 days supply | Qty: 9 | Fill #3

## 2016-01-22 MED FILL — metFORMIN HCL 1000 MG TABS: 1000 | 30 days supply | Qty: 60 | Fill #1

## 2016-01-22 MED FILL — BENAZEPRIL HCL 40 MG TABLET: 40 | 30 days supply | Qty: 30 | Fill #2

## 2016-01-22 MED FILL — JARDIANCE 10 MG TABLET: 10 | 30 days supply | Qty: 30 | Fill #2

## 2016-01-22 NOTE — Progress Notes (Signed)
Subjective:    Patient ID: Justin [name], male    DOB: May 10, 1971,    Past Medical History  Diagnosis Date  . Hypertension     x 5 yrs  . Diabetes mellitus ([place])     Type 2 x 9 yrs.  . Hyperlipidemia    Past Surgical History  Procedure Laterality Date  .  rt knee arthroscopy    . Colonoscopy  04/10/2012    Procedure: COLONOSCOPY;  Surgeon: Rogene Houston, MD;  Location: [facility];  Service: Endoscopy;  Laterality: N/A;  1200   Social History   Social History  . Marital Status: Married    Spouse Name: N/A  . Number of Children: N/A  . Years of Education: N/A   Social History Main Topics  . Smoking status: Current Every Day Smoker  . Smokeless tobacco: None     Comment: Cigar a day  . Alcohol Use: Yes     Comment: 6 pack a week  . Drug Use: No  . Sexual Activity: Not Asked   Other Topics Concern  . None   Social History Narrative   Outpatient Encounter Prescriptions as of 01/22/2016  Medication Sig  . amLODipine (NORVASC) 2.5 MG tablet Take 2.5 mg by mouth daily.  . Insulin Detemir (LEVEMIR FLEXTOUCH [place]) Inject 50 Units into the skin at bedtime.  . benazepril (LOTENSIN) 40 MG tablet TAKE 1 TABLET (40 MG TOTAL) BY MOUTH DAILY.  [name] Kitchen JARDIANCE 10 MG TABS tablet TAKE 1 TABLET BY MOUTH DAILY  . metFORMIN (GLUCOPHAGE) 1000 MG tablet TAKE 1 TABLET BY MOUTH TWICE DAILY WITH MEALS  . Vitamin D, Ergocalciferol, (DRISDOL) 50000 UNITS CAPS capsule Take 1 capsule (50,000 Units total) by mouth every 7 (seven) days.  . [DISCONTINUED] Insulin Detemir (LEVEMIR FLEXTOUCH) 100 UNIT/ML Pen Inject 30 Units into the skin at bedtime.   No facility-administered encounter medications on file as of 01/22/2016.   ALLERGIES: Allergies  Allergen Reactions  . Codeine     Hives, itching    VACCINATION STATUS:  There is no immunization history on file for this patient.  Diabetes He presents for his follow-up diabetic visit. He has type 2 diabetes mellitus. Onset time: He was  diagnosed at approximate age of 32 years. His disease course has been worsening. There are no hypoglycemic associated symptoms. Pertinent negatives for hypoglycemia include no confusion, headaches, pallor or seizures. There are no diabetic associated symptoms. Pertinent negatives for diabetes include no fatigue, no polydipsia, no polyphagia, no polyuria and no weakness. There are no hypoglycemic complications. Symptoms are worsening. There are no diabetic complications. Risk factors for coronary artery disease include diabetes mellitus, dyslipidemia, hypertension and male sex. Current diabetic treatment includes insulin injections and oral agent (dual therapy). He is compliant with treatment most of the time. His weight is stable. He is following a generally unhealthy diet. He participates in exercise intermittently. His home blood glucose trend is decreasing steadily. His breakfast blood glucose range is generally 140-180 mg/dl. His overall blood glucose range is 140-180 mg/dl. An ACE inhibitor/angiotensin II receptor blocker is being taken. Eye exam is not current.  Hypertension This is a chronic problem. The current episode started more than 1 year ago. Pertinent negatives include no headaches, neck pain or palpitations. Past treatments include ACE inhibitors. The current treatment provides moderate improvement. Compliance problems include diet.   Hyperlipidemia This is a chronic problem. The current episode started more than 1 year ago. Treatments tried: He was supposed to  be on atorvastatin, however for unclear reasons patient stopped his medication. Risk factors for coronary artery disease include dyslipidemia, diabetes mellitus, hypertension and male sex.     Review of Systems  Constitutional: Negative for fatigue and unexpected weight change.  HENT: Negative for dental problem, mouth sores and trouble swallowing.   Eyes: Negative for visual disturbance.  Respiratory: Negative for cough, choking,  chest tightness and wheezing.   Cardiovascular: Negative for palpitations and leg swelling.  Gastrointestinal: Negative for nausea, vomiting, abdominal pain, diarrhea, constipation and abdominal distention.  Endocrine: Negative for polydipsia, polyphagia and polyuria.  Genitourinary: Negative for dysuria, urgency, hematuria and flank pain.  Musculoskeletal: Negative for back pain, gait problem and neck pain.  Skin: Negative for pallor, rash and wound.  Neurological: Negative for seizures, syncope, weakness, numbness and headaches.  Psychiatric/Behavioral: Negative.  Negative for confusion and dysphoric mood.     Objective:    BP 137/84 mmHg  Pulse 96  Ht 5\' 5"  (1.651 m)  Wt 160 lb (72.576 kg)  BMI 26.63 kg/m2  SpO2 98%  Wt Readings from Last 3 Encounters:  01/22/16 160 lb (72.576 kg)  10/10/15 161 lb (73.029 kg)  06/07/15 158 lb (71.668 kg)    Physical Exam  Constitutional: He is oriented to person, place, and time. He appears well-developed and well-nourished. He is cooperative. No distress.  HENT:  Head: Normocephalic and atraumatic.  Eyes: EOM are normal.  Neck: Normal range of motion. Neck supple. No tracheal deviation present. No thyromegaly present.  Cardiovascular: Normal rate, S1 normal, S2 normal and normal heart sounds.  Exam reveals no gallop.   No murmur heard. Pulses:      Dorsalis pedis pulses are 1+ on the right side, and 1+ on the left side.       Posterior tibial pulses are 1+ on the right side, and 1+ on the left side.  Pulmonary/Chest: Breath sounds normal. No respiratory distress. He has no wheezes.  Abdominal: Soft. Bowel sounds are normal. He exhibits no distension. There is no tenderness. There is no guarding and no CVA tenderness.  Musculoskeletal: He exhibits no edema.       Right shoulder: He exhibits no swelling and no deformity.  Neurological: He is alert and oriented to person, place, and time. He has normal strength and normal reflexes. No cranial  nerve deficit or sensory deficit. Gait normal.  Skin: Skin is warm and dry. No rash noted. No cyanosis. Nails show no clubbing.  Psychiatric: He has a normal mood and affect. His speech is normal and behavior is normal. Judgment and thought content normal. Cognition and memory are normal.    Recent Results (from the past 2160 hour(s))  Basic metabolic panel     Status: Abnormal   Collection Time: 01/15/16  7:45 AM  Result Value Ref Range   Sodium 139 135 - 146 mmol/L   Potassium 4.0 3.5 - 5.3 mmol/L   Chloride 104 98 - 110 mmol/L   CO2 22 20 - 31 mmol/L   Glucose, Bld 156 (H) 65 - 99 mg/dL   BUN 12 7 - 25 mg/dL   Creat 0.62 0.60 - 1.35 mg/dL   Calcium 9.1 8.6 - 10.3 mg/dL  Hemoglobin A1c     Status: Abnormal   Collection Time: 01/15/16  7:45 AM  Result Value Ref Range   Hgb A1c MFr Bld 8.3 (H) <5.7 %    Comment:   For someone without known diabetes, a hemoglobin A1c value of 6.5% or  greater indicates that they may have diabetes and this should be confirmed with a follow-up test.   For someone with known diabetes, a value <7% indicates that their diabetes is well controlled and a value greater than or equal to 7% indicates suboptimal control. A1c targets should be individualized based on duration of diabetes, age, comorbid conditions, and other considerations.   Currently, no consensus exists for use of hemoglobin A1c for diagnosis of diabetes for children.      Mean Plasma Glucose 192 mg/dL     Assessment & Plan:   1. Uncontrolled type 2 diabetes mellitus with complication, with long-term current use of insulin ([place])   He  remains at a high risk for more acute and chronic complications of diabetes which include CAD, CVA, CKD, retinopathy, and neuropathy. These are all discussed in detail with the patient.  Patient came with Above target fasting glucose profile, and  recent A1c of  8.3% unchanged from last visit at which time he was 8 .1%.   Glucose logs and insulin  administration records pertaining to this visit,  to be scanned into patient's records.  Recent labs reviewed.   - I have re-counseled the patient on diet management  by adopting a carbohydrate restricted / protein rich  Diet.  - Suggestion is made for patient to avoid simple carbohydrates   from their diet including Cakes , Desserts, Ice Cream,  Soda (  diet and regular) , Sweet Tea , Candies,  Chips, Cookies, Artificial Sweeteners,   and "Sugar-free" Products .  This will help patient to have stable blood glucose profile and potentially avoid unintended  Weight gain.  - Patient is advised to stick to a routine mealtimes to eat 3 meals  a day and avoid unnecessary snacks ( to snack only to correct hypoglycemia).  - The patient  has been  scheduled with Jearld Fenton, RDN, CDE for individualized DM education.  - I have approached patient with the following individualized plan to manage diabetes and patient agrees.  - Emphasizing the importance of compliance, I will proceed to readjust patient's medications as follows: - I will increase Levemir to 50  units qhs, associated with monitoring of BG qam.  - He will continue MTF 1000mg  po BID, and Jardiance 10mg  po qday.  - Patient will be considered for incretin therapy as appropriate next visit. - Patient specific target  for A1c; LDL, HDL, Triglycerides, and  Waist Circumference were discussed in detail.  2) BP/HTN: Controlled. Continue current medications including ACEI/ARB. 3) Lipids/HPL: For  unclear reasons he discontinued his atorvastatin. I will approach him after repeat fasting lipid panel. He also has uncontrolled hypertriglyceridemia. However patient declined therapy for hyperlipidemia.  4)  Weight/Diet: CDE consult in progress, exercise, and carbohydrates information provided.  5) Vitamin D deficiency This is a recurring problem. I will retreat with vitamin D 50,000 units weekly for the next 12 weeks.  6) Chronic Care/Health  Maintenance:  -Patient is on ACEI/ARB  medications and encouraged to continue to follow up with Ophthalmology, Podiatrist at least yearly or according to recommendations, and advised to  stay away from smoking. I have recommended yearly flu vaccine and pneumonia vaccination at least every 5 years; moderate intensity exercise for up to 150 minutes weekly; and  sleep for at least 7 hours a day.  I advised patient to maintain close follow up with their PCP for primary care needs.  Patient is asked to bring meter and  blood glucose logs during their  next visit.   Follow up plan: Return in about 3 months (around 04/23/2016) for follow up with pre-visit labs, meter, and logs.  Glade Lloyd, MD Phone: [phone]  Fax: [phone]   01/22/2016, 5:00 PM

## 2016-01-22 NOTE — Patient Instructions (Signed)

## 2016-02-15 MED FILL — LEVEMIR FLEXTOUCH 100 UNITS: 100 | 30 days supply | Qty: 9 | Fill #4

## 2016-02-26 MED FILL — metFORMIN HCL 1000 MG TABS: 1000 | 30 days supply | Qty: 60 | Fill #2

## 2016-02-26 MED FILL — JARDIANCE 10 MG TABLET: 10 | 30 days supply | Qty: 30 | Fill #3

## 2016-02-26 MED FILL — BD PEN NDL NANO 32GX5/32: 32G X 4 MM | 30 days supply | Qty: 100 | Fill #0 | Status: TO

## 2016-02-28 ENCOUNTER — Other Ambulatory Visit: Payer: Self-pay | Admitting: *Deleted

## 2016-02-28 ENCOUNTER — Other Ambulatory Visit: Payer: Self-pay | Admitting: "Endocrinology

## 2016-02-28 MED ORDER — INSULIN DETEMIR 100 UNIT/ML FLEXPEN: 50.0000 [IU] | PEN_INJECTOR | Freq: Every day | SUBCUTANEOUS | 2 refills | Status: DC

## 2016-02-28 MED FILL — LEVEMIR FLEXTOUCH 100 UNITS: 100 | 30 days supply | Qty: 15 | Fill #0

## 2016-03-13 MED FILL — BENAZEPRIL HCL 40 MG TABLET: 40 | 30 days supply | Qty: 30 | Fill #3

## 2016-04-04 ENCOUNTER — Other Ambulatory Visit: Payer: Self-pay | Admitting: "Endocrinology

## 2016-04-04 MED FILL — metFORMIN HCL 1000 MG TABS: 1000 | 30 days supply | Qty: 60 | Fill #0

## 2016-04-04 MED FILL — JARDIANCE 10 MG TABLET: 10 | 30 days supply | Qty: 30 | Fill #4

## 2016-04-10 MED FILL — LEVEMIR FLEXTOUCH 100 UNITS: 100 | 30 days supply | Qty: 15 | Fill #1

## 2016-04-16 MED FILL — BENAZEPRIL HCL 40 MG TABLET: 40 | 30 days supply | Qty: 30 | Fill #4

## 2016-04-23 ENCOUNTER — Other Ambulatory Visit: Payer: Self-pay | Admitting: "Endocrinology

## 2016-04-23 LAB — COMPLETE METABOLIC PANEL WITH GFR
ALBUMIN: 4.3 g/dL (ref 3.6–5.1)
ALK PHOS: 62 U/L (ref 40–115)
ALT: 14 U/L (ref 9–46)
AST: 15 U/L (ref 10–40)
BUN: 14 mg/dL (ref 7–25)
CO2: 23 mmol/L (ref 20–31)
Calcium: 9.8 mg/dL (ref 8.6–10.3)
Chloride: 105 mmol/L (ref 98–110)
Creat: 0.88 mg/dL (ref 0.60–1.35)
GLUCOSE: 156 mg/dL — AB (ref 65–99)
POTASSIUM: 4.3 mmol/L (ref 3.5–5.3)
SODIUM: 139 mmol/L (ref 135–146)
Total Bilirubin: 0.2 mg/dL (ref 0.2–1.2)
Total Protein: 7 g/dL (ref 6.1–8.1)

## 2016-04-24 LAB — HEMOGLOBIN A1C
HEMOGLOBIN A1C: 7.2 % — AB (ref <5.7)
MEAN PLASMA GLUCOSE: 160 mg/dL

## 2016-04-29 ENCOUNTER — Encounter: Payer: Self-pay | Admitting: "Endocrinology

## 2016-04-29 ENCOUNTER — Ambulatory Visit (INDEPENDENT_AMBULATORY_CARE_PROVIDER_SITE_OTHER): Payer: 59 | Admitting: "Endocrinology

## 2016-04-29 VITALS — BP 131/84 | HR 101 | Ht 65.0 in | Wt 163.0 lb

## 2016-04-29 DIAGNOSIS — E7849 Other hyperlipidemia: Secondary | ICD-10-CM

## 2016-04-29 DIAGNOSIS — E784 Other hyperlipidemia: Secondary | ICD-10-CM

## 2016-04-29 DIAGNOSIS — I10 Essential (primary) hypertension: Secondary | ICD-10-CM | POA: Diagnosis not present

## 2016-04-29 DIAGNOSIS — E1165 Type 2 diabetes mellitus with hyperglycemia: Secondary | ICD-10-CM | POA: Diagnosis not present

## 2016-04-29 DIAGNOSIS — E118 Type 2 diabetes mellitus with unspecified complications: Secondary | ICD-10-CM | POA: Diagnosis not present

## 2016-04-29 DIAGNOSIS — IMO0002 Reserved for concepts with insufficient information to code with codable children: Secondary | ICD-10-CM

## 2016-04-29 DIAGNOSIS — E559 Vitamin D deficiency, unspecified: Secondary | ICD-10-CM | POA: Diagnosis not present

## 2016-04-29 DIAGNOSIS — Z794 Long term (current) use of insulin: Secondary | ICD-10-CM

## 2016-04-29 NOTE — Progress Notes (Signed)
Subjective:    Patient ID: Justin [name], male    DOB: Dec 07, 1970,    Past Medical History:  Diagnosis Date  . Diabetes mellitus ([place])    Type 2 x 9 yrs.  . Hyperlipidemia   . Hypertension    x 5 yrs   Past Surgical History:  Procedure Laterality Date  .  rt knee arthroscopy    . COLONOSCOPY  04/10/2012   Procedure: COLONOSCOPY;  Surgeon: Rogene Houston, MD;  Location: [facility];  Service: Endoscopy;  Laterality: N/A;  1200   Social History   Social History  . Marital status: Married    Spouse name: N/A  . Number of children: N/A  . Years of education: N/A   Social History Main Topics  . Smoking status: Current Every Day Smoker  . Smokeless tobacco: Never Used     Comment: Cigar a day  . Alcohol use Yes     Comment: 6 pack a week  . Drug use: No  . Sexual activity: Not Asked   Other Topics Concern  . None   Social History Narrative  . None   Outpatient Encounter Prescriptions as of 04/29/2016  Medication Sig  . amLODipine (NORVASC) 2.5 MG tablet Take 2.5 mg by mouth daily.  . benazepril (LOTENSIN) 40 MG tablet TAKE 1 TABLET (40 MG TOTAL) BY MOUTH DAILY.  [name] Kitchen Insulin Detemir (LEVEMIR FLEXTOUCH) 100 UNIT/ML Pen Inject 50 Units into the skin at bedtime.  [name] Kitchen JARDIANCE 10 MG TABS tablet TAKE 1 TABLET BY MOUTH DAILY  . metFORMIN (GLUCOPHAGE) 1000 MG tablet TAKE 1 TABLET BY MOUTH TWICE DAILY WITH MEALS  . Vitamin D, Ergocalciferol, (DRISDOL) 50000 UNITS CAPS capsule Take 1 capsule (50,000 Units total) by mouth every 7 (seven) days.   No facility-administered encounter medications on file as of 04/29/2016.    ALLERGIES: Allergies  Allergen Reactions  . Codeine     Hives, itching    VACCINATION STATUS:  There is no immunization history on file for this patient.  Diabetes  He presents for his follow-up diabetic visit. He has type 2 diabetes mellitus. Onset time: He was diagnosed at approximate age of 89 years. His disease course has been improving. There are  no hypoglycemic associated symptoms. Pertinent negatives for hypoglycemia include no confusion, headaches, pallor or seizures. There are no diabetic associated symptoms. Pertinent negatives for diabetes include no fatigue, no polydipsia, no polyphagia, no polyuria and no weakness. There are no hypoglycemic complications. Symptoms are improving. There are no diabetic complications. Risk factors for coronary artery disease include diabetes mellitus, dyslipidemia, hypertension and male sex. Current diabetic treatment includes insulin injections and oral agent (dual therapy). He is compliant with treatment most of the time. His weight is stable. He is following a generally unhealthy diet. He participates in exercise intermittently. His home blood glucose trend is decreasing steadily. His breakfast blood glucose range is generally 140-180 mg/dl. His overall blood glucose range is 140-180 mg/dl. An ACE inhibitor/angiotensin II receptor blocker is being taken. Eye exam is not current.  Hypertension  This is a chronic problem. The current episode started more than 1 year ago. Pertinent negatives include no headaches, neck pain or palpitations. Past treatments include ACE inhibitors. The current treatment provides moderate improvement. Compliance problems include diet.   Hyperlipidemia  This is a chronic problem. The current episode started more than 1 year ago. Treatments tried: He was supposed to be on atorvastatin, however for unclear reasons patient stopped his medication.  Risk factors for coronary artery disease include dyslipidemia, diabetes mellitus, hypertension and male sex.     Review of Systems  Constitutional: Negative for fatigue and unexpected weight change.  HENT: Negative for dental problem, mouth sores and trouble swallowing.   Eyes: Negative for visual disturbance.  Respiratory: Negative for cough, choking, chest tightness and wheezing.   Cardiovascular: Negative for palpitations and leg  swelling.  Gastrointestinal: Negative for abdominal distention, abdominal pain, constipation, diarrhea, nausea and vomiting.  Endocrine: Negative for polydipsia, polyphagia and polyuria.  Genitourinary: Negative for dysuria, flank pain, hematuria and urgency.  Musculoskeletal: Negative for back pain, gait problem and neck pain.  Skin: Negative for pallor, rash and wound.  Neurological: Negative for seizures, syncope, weakness, numbness and headaches.  Psychiatric/Behavioral: Negative.  Negative for confusion and dysphoric mood.     Objective:    BP 131/84   Pulse (!) 101   Ht 5\' 5"  (1.651 m)   Wt 163 lb (73.9 kg)   BMI 27.12 kg/m   Wt Readings from Last 3 Encounters:  04/29/16 163 lb (73.9 kg)  01/22/16 160 lb (72.6 kg)  10/10/15 161 lb (73 kg)    Physical Exam  Constitutional: He is oriented to person, place, and time. He appears well-developed and well-nourished. He is cooperative. No distress.  HENT:  Head: Normocephalic and atraumatic.  Eyes: EOM are normal.  Neck: Normal range of motion. Neck supple. No tracheal deviation present. No thyromegaly present.  Cardiovascular: Normal rate, S1 normal, S2 normal and normal heart sounds.  Exam reveals no gallop.   No murmur heard. Pulses:      Dorsalis pedis pulses are 1+ on the right side, and 1+ on the left side.       Posterior tibial pulses are 1+ on the right side, and 1+ on the left side.  Pulmonary/Chest: Breath sounds normal. No respiratory distress. He has no wheezes.  Abdominal: Soft. Bowel sounds are normal. He exhibits no distension. There is no tenderness. There is no guarding and no CVA tenderness.  Musculoskeletal: He exhibits no edema.       Right shoulder: He exhibits no swelling and no deformity.  Neurological: He is alert and oriented to person, place, and time. He has normal strength and normal reflexes. No cranial nerve deficit or sensory deficit. Gait normal.  Skin: Skin is warm and dry. No rash noted. No  cyanosis. Nails show no clubbing.  Psychiatric: He has a normal mood and affect. His speech is normal and behavior is normal. Judgment and thought content normal. Cognition and memory are normal.    Recent Results (from the past 2160 hour(s))  COMPLETE METABOLIC PANEL WITH GFR     Status: Abnormal   Collection Time: 04/23/16  7:33 AM  Result Value Ref Range   Sodium 139 135 - 146 mmol/L   Potassium 4.3 3.5 - 5.3 mmol/L   Chloride 105 98 - 110 mmol/L   CO2 23 20 - 31 mmol/L   Glucose, Bld 156 (H) 65 - 99 mg/dL   BUN 14 7 - 25 mg/dL   Creat 0.88 0.60 - 1.35 mg/dL   Total Bilirubin 0.2 0.2 - 1.2 mg/dL   Alkaline Phosphatase 62 40 - 115 U/L   AST 15 10 - 40 U/L   ALT 14 9 - 46 U/L   Total Protein 7.0 6.1 - 8.1 g/dL   Albumin 4.3 3.6 - 5.1 g/dL   Calcium 9.8 8.6 - 10.3 mg/dL   GFR, Est African American >  89 >=60 mL/min   GFR, Est Non African American >89 >=60 mL/min  Hemoglobin A1c     Status: Abnormal   Collection Time: 04/23/16  7:33 AM  Result Value Ref Range   Hgb A1c MFr Bld 7.2 (H) <5.7 %    Comment:   For someone without known diabetes, a hemoglobin A1c value of 6.5% or greater indicates that they may have diabetes and this should be confirmed with a follow-up test.   For someone with known diabetes, a value <7% indicates that their diabetes is well controlled and a value greater than or equal to 7% indicates suboptimal control. A1c targets should be individualized based on duration of diabetes, age, comorbid conditions, and other considerations.   Currently, no consensus exists for use of hemoglobin A1c for diagnosis of diabetes for children.      Mean Plasma Glucose 160 mg/dL     Assessment & Plan:   1. Uncontrolled type 2 diabetes mellitus with complication, with long-term current use of insulin ([place])   He  remains at a high risk for more acute and chronic complications of diabetes which include CAD, CVA, CKD, retinopathy, and neuropathy. These are all  discussed in detail with the patient.  Patient came with Above target fasting glucose profile, and  recent A1c of   7.2% improving from 8.3% .    Glucose logs and insulin administration records pertaining to this visit,  to be scanned into patient's records.  Recent labs reviewed.   - I have re-counseled the patient on diet management  by adopting a carbohydrate restricted / protein rich  Diet.  - Suggestion is made for patient to avoid simple carbohydrates   from their diet including Cakes , Desserts, Ice Cream,  Soda (  diet and regular) , Sweet Tea , Candies,  Chips, Cookies, Artificial Sweeteners,   and "Sugar-free" Products .  This will help patient to have stable blood glucose profile and potentially avoid unintended  Weight gain.  - Patient is advised to stick to a routine mealtimes to eat 3 meals  a day and avoid unnecessary snacks ( to snack only to correct hypoglycemia).  - The patient  has been  scheduled with Jearld Fenton, RDN, CDE for individualized DM education.  - I have approached patient with the following individualized plan to manage diabetes and patient agrees.  - Emphasizing the importance of compliance, I will continue with Levemir  40  units qhs, associated with monitoring of BG qam.  - He will continue MTF 1000mg  po BID, and Jardiance 10mg  po qday.  - Patient will be considered for incretin therapy as appropriate next visit. - Patient specific target  for A1c; LDL, HDL, Triglycerides, and  Waist Circumference were discussed in detail.  2) BP/HTN: Controlled. Continue current medications including ACEI/ARB. 3) Lipids/HPL: For  unclear reasons he discontinued his atorvastatin. 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 However patient declined therapy for hyperlipidemia. I will repeat his fasting lipid panel before next visit.  4)  Weight/Diet: CDE consult in progress, exercise, and carbohydrates information provided.  5) Vitamin D deficiency This is a  recurring problem. I will retreat with vitamin D 50,000 units weekly for the next 12 weeks.  6) Chronic Care/Health Maintenance:  -Patient is on ACEI/ARB  medications and encouraged to continue to follow up with Ophthalmology, Podiatrist at least yearly or according to recommendations, and advised to  stay away from smoking. I have recommended yearly flu vaccine and pneumonia vaccination at least every  5 years; moderate intensity exercise for up to 150 minutes weekly; and  sleep for at least 7 hours a day.  I advised patient to maintain close follow up with their PCP for primary care needs.  Patient is asked to bring meter and  blood glucose logs during their next visit.   Follow up plan: Return in about 3 months (around 07/30/2016) for follow up with pre-visit labs, meter, and logs.  Glade Lloyd, MD Phone: [phone]  Fax: [phone]   04/29/2016, 11:25 AM

## 2016-05-03 MED FILL — JARDIANCE 10 MG TABLET: 10 | 30 days supply | Qty: 30 | Fill #5

## 2016-05-16 MED FILL — metFORMIN HCL 1000 MG TABS: 1000 | 30 days supply | Qty: 60 | Fill #1

## 2016-05-27 MED FILL — BENAZEPRIL HCL 40 MG TABLET: 40 | 30 days supply | Qty: 30 | Fill #5

## 2016-05-27 MED FILL — LEVEMIR FLEXTOUCH 100 UNITS: 100 | 30 days supply | Qty: 15 | Fill #2

## 2016-06-10 ENCOUNTER — Other Ambulatory Visit: Payer: Self-pay | Admitting: "Endocrinology

## 2016-06-10 MED FILL — JARDIANCE 10 MG TABLET: 10 | 30 days supply | Qty: 30 | Fill #0 | Status: TO

## 2016-06-17 MED FILL — metFORMIN HCL 1000 MG TABS: 1000 | 30 days supply | Qty: 60 | Fill #2

## 2016-07-03 ENCOUNTER — Other Ambulatory Visit: Payer: Self-pay | Admitting: "Endocrinology

## 2016-07-03 MED FILL — BD PEN NDL NANO 32GX5/32: 32G X 4 MM | 30 days supply | Qty: 100 | Fill #1 | Status: TO

## 2016-07-03 MED FILL — BD PEN NDL NANO 32GX5/32": 32G X 4 MM | 30 days supply | Qty: 100 | Fill #1 | Status: TO

## 2016-07-03 MED FILL — BENAZEPRIL HCL 40 MG TABLET: 40 | 30 days supply | Qty: 30 | Fill #0 | Status: TO

## 2016-07-10 MED FILL — JARDIANCE 10 MG TABLET: 10 | 30 days supply | Qty: 30 | Fill #1 | Status: TO

## 2016-07-11 MED FILL — LEVEMIR FLEXTOUCH 100 UNITS: 100 | 30 days supply | Qty: 15 | Fill #0 | Status: TO

## 2016-07-24 ENCOUNTER — Other Ambulatory Visit: Payer: Self-pay | Admitting: "Endocrinology

## 2016-07-24 MED FILL — metFORMIN HCL 1000 MG TABS: 1000 | 30 days supply | Qty: 60 | Fill #0 | Status: TO

## 2016-08-05 ENCOUNTER — Ambulatory Visit: Payer: 59 | Admitting: "Endocrinology

## 2016-08-06 ENCOUNTER — Other Ambulatory Visit: Payer: Self-pay | Admitting: "Endocrinology

## 2016-08-06 DIAGNOSIS — E118 Type 2 diabetes mellitus with unspecified complications: Principal | ICD-10-CM

## 2016-08-06 DIAGNOSIS — E1165 Type 2 diabetes mellitus with hyperglycemia: Secondary | ICD-10-CM

## 2016-08-06 DIAGNOSIS — IMO0002 Reserved for concepts with insufficient information to code with codable children: Secondary | ICD-10-CM

## 2016-08-06 DIAGNOSIS — Z794 Long term (current) use of insulin: Principal | ICD-10-CM

## 2016-08-06 MED ORDER — DAPAGLIFLOZIN PROPANEDIOL 10 MG PO TABS: 10.0000 mg | ORAL_TABLET | Freq: Every day | ORAL | 3 refills | Status: DC

## 2016-08-06 MED FILL — BENAZEPRIL HCL 40 MG TABLET: 40 | 30 days supply | Qty: 30 | Fill #1 | Status: TO

## 2016-08-06 MED FILL — FARXIGA 10 MG TABLET: 10 | 30 days supply | Qty: 30 | Fill #0 | Status: TO

## 2016-08-16 ENCOUNTER — Ambulatory Visit: Payer: 59 | Admitting: "Endocrinology

## 2016-09-14 ENCOUNTER — Other Ambulatory Visit: Payer: Self-pay | Admitting: "Endocrinology

## 2016-09-14 LAB — LIPID PANEL
Cholesterol: 224 mg/dL — ABNORMAL HIGH (ref <200)
HDL: 39 mg/dL — ABNORMAL LOW (ref >40)
LDL Cholesterol: 127 mg/dL — ABNORMAL HIGH (ref <100)
Total CHOL/HDL Ratio: 5.7 Ratio — ABNORMAL HIGH (ref <5.0)
Triglycerides: 292 mg/dL — ABNORMAL HIGH (ref <150)
VLDL: 58 mg/dL — ABNORMAL HIGH (ref <30)

## 2016-09-14 LAB — COMPREHENSIVE METABOLIC PANEL
ALK PHOS: 71 U/L (ref 40–115)
ALT: 41 U/L (ref 9–46)
AST: 35 U/L (ref 10–40)
Albumin: 4.8 g/dL (ref 3.6–5.1)
BUN: 12 mg/dL (ref 7–25)
CO2: 25 mmol/L (ref 20–31)
CREATININE: 0.85 mg/dL (ref 0.60–1.35)
Calcium: 10 mg/dL (ref 8.6–10.3)
Chloride: 108 mmol/L (ref 98–110)
GLUCOSE: 173 mg/dL — AB (ref 65–99)
POTASSIUM: 4.3 mmol/L (ref 3.5–5.3)
SODIUM: 143 mmol/L (ref 135–146)
Total Bilirubin: 0.3 mg/dL (ref 0.2–1.2)
Total Protein: 7.6 g/dL (ref 6.1–8.1)

## 2016-09-15 LAB — HEMOGLOBIN A1C
HEMOGLOBIN A1C: 7.5 % — AB (ref <5.7)
MEAN PLASMA GLUCOSE: 169 mg/dL

## 2016-09-16 DIAGNOSIS — R1011 Right upper quadrant pain: Secondary | ICD-10-CM | POA: Diagnosis not present

## 2016-09-16 DIAGNOSIS — Z9049 Acquired absence of other specified parts of digestive tract: Secondary | ICD-10-CM | POA: Diagnosis not present

## 2016-09-16 DIAGNOSIS — R05 Cough: Secondary | ICD-10-CM | POA: Diagnosis not present

## 2016-09-23 ENCOUNTER — Encounter: Payer: Self-pay | Admitting: "Endocrinology

## 2016-09-23 ENCOUNTER — Ambulatory Visit (INDEPENDENT_AMBULATORY_CARE_PROVIDER_SITE_OTHER): Payer: 59 | Admitting: "Endocrinology

## 2016-09-23 VITALS — BP 138/86 | HR 93 | Ht 65.0 in | Wt 162.0 lb

## 2016-09-23 DIAGNOSIS — E1165 Type 2 diabetes mellitus with hyperglycemia: Secondary | ICD-10-CM | POA: Diagnosis not present

## 2016-09-23 DIAGNOSIS — Z794 Long term (current) use of insulin: Secondary | ICD-10-CM | POA: Diagnosis not present

## 2016-09-23 DIAGNOSIS — E118 Type 2 diabetes mellitus with unspecified complications: Secondary | ICD-10-CM

## 2016-09-23 DIAGNOSIS — E7849 Other hyperlipidemia: Secondary | ICD-10-CM

## 2016-09-23 DIAGNOSIS — I10 Essential (primary) hypertension: Secondary | ICD-10-CM | POA: Diagnosis not present

## 2016-09-23 DIAGNOSIS — IMO0002 Reserved for concepts with insufficient information to code with codable children: Secondary | ICD-10-CM

## 2016-09-23 DIAGNOSIS — E784 Other hyperlipidemia: Secondary | ICD-10-CM

## 2016-09-23 MED ORDER — BENAZEPRIL HCL 40 MG PO TABS
ORAL_TABLET | ORAL | 1 refills | Status: DC
Start: 2016-09-23 — End: 2017-05-07

## 2016-09-23 NOTE — Progress Notes (Signed)
Subjective:    Patient ID: Justin [name], male    DOB: [birth year],    Past Medical History:  Diagnosis Date  . Diabetes mellitus ([place])    Type 2 x 9 yrs.  . Hyperlipidemia   . Hypertension    x 5 yrs   Past Surgical History:  Procedure Laterality Date  .  rt knee arthroscopy    . COLONOSCOPY  04/10/2012   Procedure: COLONOSCOPY;  Surgeon: Rogene Houston, MD;  Location: [facility];  Service: Endoscopy;  Laterality: N/A;  1200   Social History   Social History  . Marital status: Married    Spouse name: N/A  . Number of children: N/A  . Years of education: N/A   Social History Main Topics  . Smoking status: Current Every Day Smoker  . Smokeless tobacco: Never Used     Comment: Cigar a day  . Alcohol use Yes     Comment: 6 pack a week  . Drug use: No  . Sexual activity: Not Asked   Other Topics Concern  . None   Social History Narrative  . None   Outpatient Encounter Prescriptions as of 09/23/2016  Medication Sig  . amLODipine (NORVASC) 2.5 MG tablet Take 2.5 mg by mouth daily.  . benazepril (LOTENSIN) 40 MG tablet TAKE 1 TABLET (40 MG TOTAL) BY MOUTH DAILY.  . dapagliflozin propanediol (FARXIGA) 10 MG TABS tablet Take 10 mg by mouth daily.  . Insulin Detemir (LEVEMIR FLEXTOUCH) 100 UNIT/ML Pen Inject 50 Units into the skin at bedtime.  . metFORMIN (GLUCOPHAGE) 1000 MG tablet TAKE 1 TABLET BY MOUTH TWICE DAILY WITH MEALS  . Vitamin D, Ergocalciferol, (DRISDOL) 50000 UNITS CAPS capsule Take 1 capsule (50,000 Units total) by mouth every 7 (seven) days.  . [DISCONTINUED] benazepril (LOTENSIN) 40 MG tablet TAKE 1 TABLET (40 MG TOTAL) BY MOUTH DAILY.   No facility-administered encounter medications on file as of 09/23/2016.    ALLERGIES: Allergies  Allergen Reactions  . Codeine     Hives, itching    VACCINATION STATUS:  There is no immunization history on file for this patient.  Diabetes  He presents for his follow-up diabetic visit. He has type 2  diabetes mellitus. Onset time: He was diagnosed at approximate age of 23 years. His disease course has been stable. There are no hypoglycemic associated symptoms. Pertinent negatives for hypoglycemia include no confusion, headaches, pallor or seizures. There are no diabetic associated symptoms. Pertinent negatives for diabetes include no fatigue, no polydipsia, no polyphagia, no polyuria and no weakness. There are no hypoglycemic complications. Symptoms are stable. There are no diabetic complications. Risk factors for coronary artery disease include diabetes mellitus, dyslipidemia, hypertension and male sex. Current diabetic treatment includes insulin injections and oral agent (dual therapy). He is compliant with treatment most of the time. His weight is stable. He is following a generally unhealthy diet. He participates in exercise intermittently. His home blood glucose trend is decreasing steadily. His breakfast blood glucose range is generally 140-180 mg/dl. His overall blood glucose range is 140-180 mg/dl. An ACE inhibitor/angiotensin II receptor blocker is being taken. Eye exam is not current.  Hypertension  This is a chronic problem. The current episode started more than 1 year ago. Pertinent negatives include no headaches, neck pain or palpitations. Past treatments include ACE inhibitors. The current treatment provides moderate improvement. Compliance problems include diet.   Hyperlipidemia  This is a chronic problem. The current episode started more than 1  year ago. Treatments tried: He was supposed to be on atorvastatin, however for unclear reasons patient stopped his medication. Risk factors for coronary artery disease include dyslipidemia, diabetes mellitus, hypertension and male sex.     Review of Systems  Constitutional: Negative for fatigue and unexpected weight change.  HENT: Negative for dental problem, mouth sores and trouble swallowing.   Eyes: Negative for visual disturbance.   Respiratory: Negative for cough, choking, chest tightness and wheezing.   Cardiovascular: Negative for palpitations and leg swelling.  Gastrointestinal: Negative for abdominal distention, abdominal pain, constipation, diarrhea, nausea and vomiting.  Endocrine: Negative for polydipsia, polyphagia and polyuria.  Genitourinary: Negative for dysuria, flank pain, hematuria and urgency.  Musculoskeletal: Negative for back pain, gait problem and neck pain.  Skin: Negative for pallor, rash and wound.  Neurological: Negative for seizures, syncope, weakness, numbness and headaches.  Psychiatric/Behavioral: Negative.  Negative for confusion and dysphoric mood.     Objective:    BP 138/86   Pulse 93   Ht 5\' 5"  (1.651 m)   Wt 162 lb (73.5 kg)   BMI 26.96 kg/m   Wt Readings from Last 3 Encounters:  09/23/16 162 lb (73.5 kg)  04/29/16 163 lb (73.9 kg)  01/22/16 160 lb (72.6 kg)    Physical Exam  Constitutional: He is oriented to person, place, and time. He appears well-developed and well-nourished. He is cooperative. No distress.  HENT:  Head: Normocephalic and atraumatic.  Eyes: EOM are normal.  Neck: Normal range of motion. Neck supple. No tracheal deviation present. No thyromegaly present.  Cardiovascular: Normal rate, S1 normal, S2 normal and normal heart sounds.  Exam reveals no gallop.   No murmur heard. Pulses:      Dorsalis pedis pulses are 1+ on the right side, and 1+ on the left side.       Posterior tibial pulses are 1+ on the right side, and 1+ on the left side.  Pulmonary/Chest: Breath sounds normal. No respiratory distress. He has no wheezes.  Abdominal: Soft. Bowel sounds are normal. He exhibits no distension. There is no tenderness. There is no guarding and no CVA tenderness.  Musculoskeletal: He exhibits no edema.       Right shoulder: He exhibits no swelling and no deformity.  Neurological: He is alert and oriented to person, place, and time. He has normal strength and  normal reflexes. No cranial nerve deficit or sensory deficit. Gait normal.  Skin: Skin is warm and dry. No rash noted. No cyanosis. Nails show no clubbing.  Psychiatric: He has a normal mood and affect. His speech is normal and behavior is normal. Judgment and thought content normal. Cognition and memory are normal.    Recent Results (from the past 2160 hour(s))  Comprehensive metabolic panel     Status: Abnormal   Collection Time: 09/14/16  8:38 AM  Result Value Ref Range   Sodium 143 135 - 146 mmol/L   Potassium 4.3 3.5 - 5.3 mmol/L   Chloride 108 98 - 110 mmol/L   CO2 25 20 - 31 mmol/L   Glucose, Bld 173 (H) 65 - 99 mg/dL   BUN 12 7 - 25 mg/dL   Creat 0.85 0.60 - 1.35 mg/dL   Total Bilirubin 0.3 0.2 - 1.2 mg/dL   Alkaline Phosphatase 71 40 - 115 U/L   AST 35 10 - 40 U/L   ALT 41 9 - 46 U/L   Total Protein 7.6 6.1 - 8.1 g/dL   Albumin 4.8 3.6 -  5.1 g/dL   Calcium 10.0 8.6 - 10.3 mg/dL  Lipid panel     Status: Abnormal   Collection Time: 09/14/16  8:38 AM  Result Value Ref Range   Cholesterol 224 (H) <200 mg/dL   Triglycerides 292 (H) <150 mg/dL   HDL 39 (L) >40 mg/dL   Total CHOL/HDL Ratio 5.7 (H) <5.0 Ratio   VLDL 58 (H) <30 mg/dL   LDL Cholesterol 127 (H) <100 mg/dL  Hemoglobin A1c     Status: Abnormal   Collection Time: 09/14/16  8:38 AM  Result Value Ref Range   Hgb A1c MFr Bld 7.5 (H) <5.7 %    Comment:   For someone without known diabetes, a hemoglobin A1c value of 6.5% or greater indicates that they may have diabetes and this should be confirmed with a follow-up test.   For someone with known diabetes, a value <7% indicates that their diabetes is well controlled and a value greater than or equal to 7% indicates suboptimal control. A1c targets should be individualized based on duration of diabetes, age, comorbid conditions, and other considerations.   Currently, no consensus exists for use of hemoglobin A1c for diagnosis of diabetes for children.      Mean  Plasma Glucose 169 mg/dL     Assessment & Plan:   1. Uncontrolled type 2 diabetes mellitus with complication, with long-term current use of insulin ([place])   He  remains at a high risk for more acute and chronic complications of diabetes which include CAD, CVA, CKD, retinopathy, and neuropathy. These are all discussed in detail with the patient.  Patient came with Above target fasting glucose profile, and  recent A1c of   7.5 % , generally improving from 8.3% .    Glucose logs and insulin administration records pertaining to this visit,  to be scanned into patient's records.  Recent labs reviewed.   - I have re-counseled the patient on diet management  by adopting a carbohydrate restricted / protein rich  Diet.  - Suggestion is made for patient to avoid simple carbohydrates   from their diet including Cakes , Desserts, Ice Cream,  Soda (  diet and regular) , Sweet Tea , Candies,  Chips, Cookies, Artificial Sweeteners,   and "Sugar-free" Products .  This will help patient to have stable blood glucose profile and potentially avoid unintended  Weight gain.  - Patient is advised to stick to a routine mealtimes to eat 3 meals  a day and avoid unnecessary snacks ( to snack only to correct hypoglycemia).  - The patient  has been  scheduled with Jearld Fenton, RDN, CDE for individualized DM education.  - I have approached patient with the following individualized plan to manage diabetes and patient agrees.  - Emphasizing the importance of compliance, I will continue with Levemir  40  units qhs, associated with monitoring of BG qam.  - He will continue MTF 1000mg  po BID, and Farxiga  10mg  po qday.  - Patient will be considered for incretin therapy as appropriate next visit. - Patient specific target  for A1c; LDL, HDL, Triglycerides, and  Waist Circumference were discussed in detail.  2) BP/HTN: Controlled. Continue current medications including ACEI/ARB. 3) Lipids/HPL: For  unclear reasons  he discontinued his atorvastatin. 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 However patient declined therapy for hyperlipidemia.  He is advised to avoid butter and fried food. - His LDL is still above target at 127. He declines therapy with statins.  4)  Weight/Diet:  CDE consult in progress, exercise, and carbohydrates information provided.  5) Vitamin D deficiency - He is status post therapy with vitamin D 50,000 units weekly for 12 weeks.  6) Chronic Care/Health Maintenance:  -Patient is on ACEI/ARB  medications and encouraged to continue to follow up with Ophthalmology, Podiatrist at least yearly or according to recommendations, and advised to  stay away from smoking. I have recommended yearly flu vaccine and pneumonia vaccination at least every 5 years; moderate intensity exercise for up to 150 minutes weekly; and  sleep for at least 7 hours a day.  I advised patient to maintain close follow up with their PCP for primary care needs.  Patient is asked to bring meter and  blood glucose logs during their next visit.   Follow up plan: Return for meter, and logs.  Glade Lloyd, MD Phone: [phone]  Fax: [phone]   09/23/2016, 1:36 PM

## 2016-09-23 NOTE — Patient Instructions (Signed)

## 2016-11-01 ENCOUNTER — Other Ambulatory Visit: Payer: Self-pay

## 2016-11-01 MED ORDER — DAPAGLIFLOZIN PROPANEDIOL 10 MG PO TABS
10.0000 mg | ORAL_TABLET | Freq: Every day | ORAL | 1 refills | Status: DC
Start: 2016-11-01 — End: 2016-11-04

## 2016-11-02 ENCOUNTER — Other Ambulatory Visit: Payer: Self-pay | Admitting: "Endocrinology

## 2016-11-04 ENCOUNTER — Other Ambulatory Visit: Payer: Self-pay

## 2016-11-04 MED ORDER — DAPAGLIFLOZIN PROPANEDIOL 10 MG PO TABS: 10.0000 mg | ORAL_TABLET | Freq: Every day | ORAL | 1 refills | Status: DC

## 2016-11-04 MED ORDER — METFORMIN HCL 1000 MG PO TABS: 1000.0000 mg | ORAL_TABLET | Freq: Two times a day (BID) | ORAL | 1 refills | Status: DC

## 2016-11-05 ENCOUNTER — Other Ambulatory Visit: Payer: Self-pay

## 2016-11-05 MED ORDER — METFORMIN HCL 1000 MG PO TABS: 1000.0000 mg | ORAL_TABLET | Freq: Two times a day (BID) | ORAL | 1 refills | Status: DC

## 2016-11-28 ENCOUNTER — Other Ambulatory Visit: Payer: Self-pay

## 2016-11-28 MED ORDER — INSULIN DETEMIR 100 UNIT/ML FLEXPEN: PEN_INJECTOR | SUBCUTANEOUS | 0 refills | Status: DC

## 2016-11-29 ENCOUNTER — Other Ambulatory Visit: Payer: Self-pay

## 2016-11-29 MED ORDER — INSULIN DETEMIR 100 UNIT/ML FLEXPEN: PEN_INJECTOR | SUBCUTANEOUS | 0 refills | Status: DC

## 2016-12-19 ENCOUNTER — Other Ambulatory Visit: Payer: Self-pay | Admitting: "Endocrinology

## 2016-12-25 ENCOUNTER — Ambulatory Visit: Payer: 59 | Admitting: "Endocrinology

## 2016-12-25 ENCOUNTER — Encounter: Payer: Self-pay | Admitting: "Endocrinology

## 2017-01-09 DIAGNOSIS — Z1231 Encounter for screening mammogram for malignant neoplasm of breast: Secondary | ICD-10-CM | POA: Diagnosis not present

## 2017-02-25 ENCOUNTER — Other Ambulatory Visit: Payer: Self-pay | Admitting: "Endocrinology

## 2017-02-25 LAB — COMPREHENSIVE METABOLIC PANEL
ALBUMIN: 4.4 g/dL (ref 3.6–5.1)
ALT: 24 U/L (ref 9–46)
AST: 17 U/L (ref 10–40)
Alkaline Phosphatase: 75 U/L (ref 40–115)
BUN: 13 mg/dL (ref 7–25)
CALCIUM: 9.2 mg/dL (ref 8.6–10.3)
CHLORIDE: 104 mmol/L (ref 98–110)
CO2: 26 mmol/L (ref 20–31)
Creat: 0.83 mg/dL (ref 0.60–1.35)
GLUCOSE: 101 mg/dL — AB (ref 65–99)
POTASSIUM: 4.5 mmol/L (ref 3.5–5.3)
Sodium: 139 mmol/L (ref 135–146)
Total Bilirubin: 0.3 mg/dL (ref 0.2–1.2)
Total Protein: 6.9 g/dL (ref 6.1–8.1)

## 2017-02-26 LAB — HEMOGLOBIN A1C
Hgb A1c MFr Bld: 8.4 % — ABNORMAL HIGH (ref <5.7)
Mean Plasma Glucose: 194 mg/dL

## 2017-03-04 ENCOUNTER — Encounter: Payer: Self-pay | Admitting: "Endocrinology

## 2017-03-04 ENCOUNTER — Ambulatory Visit (INDEPENDENT_AMBULATORY_CARE_PROVIDER_SITE_OTHER): Payer: 59 | Admitting: "Endocrinology

## 2017-03-04 VITALS — BP 134/90 | HR 83 | Wt 162.0 lb

## 2017-03-04 DIAGNOSIS — Z794 Long term (current) use of insulin: Secondary | ICD-10-CM | POA: Diagnosis not present

## 2017-03-04 DIAGNOSIS — I10 Essential (primary) hypertension: Secondary | ICD-10-CM | POA: Diagnosis not present

## 2017-03-04 DIAGNOSIS — E784 Other hyperlipidemia: Secondary | ICD-10-CM

## 2017-03-04 DIAGNOSIS — E118 Type 2 diabetes mellitus with unspecified complications: Secondary | ICD-10-CM | POA: Diagnosis not present

## 2017-03-04 DIAGNOSIS — IMO0002 Reserved for concepts with insufficient information to code with codable children: Secondary | ICD-10-CM

## 2017-03-04 DIAGNOSIS — E7849 Other hyperlipidemia: Secondary | ICD-10-CM

## 2017-03-04 DIAGNOSIS — E1165 Type 2 diabetes mellitus with hyperglycemia: Secondary | ICD-10-CM | POA: Diagnosis not present

## 2017-03-04 MED ORDER — DAPAGLIFLOZIN PRO-METFORMIN ER 10-1000 MG PO TB24: 1.0000 | ORAL_TABLET | Freq: Every day | ORAL | 3 refills | Status: DC

## 2017-03-04 NOTE — Progress Notes (Signed)
Subjective:    Patient ID: Justin [name], male    DOB: 10-22-1970,    Past Medical History:  Diagnosis Date  . Diabetes mellitus ([place])    Type 2 x 9 yrs.  . Hyperlipidemia   . Hypertension    x 5 yrs   Past Surgical History:  Procedure Laterality Date  .  rt knee arthroscopy    . COLONOSCOPY  04/10/2012   Procedure: COLONOSCOPY;  Surgeon: Rogene Houston, MD;  Location: [facility];  Service: Endoscopy;  Laterality: N/A;  1200   Social History   Social History  . Marital status: Married    Spouse name: N/A  . Number of children: N/A  . Years of education: N/A   Social History Main Topics  . Smoking status: Current Every Day Smoker  . Smokeless tobacco: Never Used     Comment: Cigar a day  . Alcohol use Yes     Comment: 6 pack a week  . Drug use: No  . Sexual activity: Not Asked   Other Topics Concern  . None   Social History Narrative  . None   Outpatient Encounter Prescriptions as of 03/04/2017  Medication Sig  . amLODipine (NORVASC) 2.5 MG tablet Take 2.5 mg by mouth daily.  . BD PEN NEEDLE NANO U/F 32G X 4 MM MISC USE AS DIRECTED TWICE DAILY  . benazepril (LOTENSIN) 40 MG tablet TAKE 1 TABLET (40 MG TOTAL) BY MOUTH DAILY.  [name] Kitchen Insulin Detemir (LEVEMIR FLEXTOUCH) 100 UNIT/ML Pen INJECT 50 UNITS SUBCUTANEOUSLY AT BEDTIME  . [DISCONTINUED] dapagliflozin propanediol (FARXIGA) 10 MG TABS tablet Take 10 mg by mouth daily.  . [DISCONTINUED] metFORMIN (GLUCOPHAGE) 1000 MG tablet Take 1 tablet (1,000 mg total) by mouth 2 (two) times daily with a meal.  . Dapagliflozin-Metformin HCl ER (XIGDUO XR) 04-999 MG TB24 Take 1 tablet by mouth daily after breakfast.  . [DISCONTINUED] Vitamin D, Ergocalciferol, (DRISDOL) 50000 UNITS CAPS capsule Take 1 capsule (50,000 Units total) by mouth every 7 (seven) days.   No facility-administered encounter medications on file as of 03/04/2017.    ALLERGIES: Allergies  Allergen Reactions  . Codeine     Hives, itching     VACCINATION STATUS:  There is no immunization history on file for this patient.  Diabetes  He presents for his follow-up diabetic visit. He has type 2 diabetes mellitus. Onset time: He was diagnosed at approximate age of 87 years. His disease course has been worsening. There are no hypoglycemic associated symptoms. Pertinent negatives for hypoglycemia include no confusion, headaches, pallor or seizures. There are no diabetic associated symptoms. Pertinent negatives for diabetes include no fatigue, no polydipsia, no polyphagia, no polyuria and no weakness. There are no hypoglycemic complications. Symptoms are worsening. There are no diabetic complications. Risk factors for coronary artery disease include diabetes mellitus, dyslipidemia, hypertension and male sex. Current diabetic treatment includes insulin injections and oral agent (dual therapy). He is compliant with treatment most of the time. His weight is stable. He is following a generally unhealthy diet. He participates in exercise intermittently. His home blood glucose trend is increasing steadily. His breakfast blood glucose range is generally 140-180 mg/dl. His overall blood glucose range is 140-180 mg/dl. An ACE inhibitor/angiotensin II receptor blocker is being taken. Eye exam is not current.  Hypertension  This is a chronic problem. The current episode started more than 1 year ago. Pertinent negatives include no headaches, neck pain or palpitations. Past treatments include ACE inhibitors. The  current treatment provides moderate improvement. Compliance problems include diet.   Hyperlipidemia  This is a chronic problem. The current episode started more than 1 year ago. Treatments tried: He was supposed to be on atorvastatin, however for unclear reasons patient stopped his medication. Risk factors for coronary artery disease include dyslipidemia, diabetes mellitus, hypertension and male sex.     Review of Systems  Constitutional: Negative  for fatigue and unexpected weight change.  HENT: Negative for dental problem, mouth sores and trouble swallowing.   Eyes: Negative for visual disturbance.  Respiratory: Negative for cough, choking, chest tightness and wheezing.   Cardiovascular: Negative for palpitations and leg swelling.  Gastrointestinal: Negative for abdominal distention, abdominal pain, constipation, diarrhea, nausea and vomiting.  Endocrine: Negative for polydipsia, polyphagia and polyuria.  Genitourinary: Negative for dysuria, flank pain, hematuria and urgency.  Musculoskeletal: Negative for back pain, gait problem and neck pain.  Skin: Negative for pallor, rash and wound.  Neurological: Negative for seizures, syncope, weakness, numbness and headaches.  Psychiatric/Behavioral: Negative.  Negative for confusion and dysphoric mood.     Objective:    BP 134/90   Pulse 83   Wt 162 lb (73.5 kg)   SpO2 97%   BMI 26.96 kg/m   Wt Readings from Last 3 Encounters:  03/04/17 162 lb (73.5 kg)  09/23/16 162 lb (73.5 kg)  04/29/16 163 lb (73.9 kg)    Physical Exam  Constitutional: He is oriented to person, place, and time. He appears well-developed and well-nourished. He is cooperative. No distress.  HENT:  Head: Normocephalic and atraumatic.  Eyes: EOM are normal.  Neck: Normal range of motion. Neck supple. No tracheal deviation present. No thyromegaly present.  Cardiovascular: Normal rate, S1 normal, S2 normal and normal heart sounds.  Exam reveals no gallop.   No murmur heard. Pulses:      Dorsalis pedis pulses are 1+ on the right side, and 1+ on the left side.       Posterior tibial pulses are 1+ on the right side, and 1+ on the left side.  Pulmonary/Chest: Breath sounds normal. No respiratory distress. He has no wheezes.  Abdominal: Soft. Bowel sounds are normal. He exhibits no distension. There is no tenderness. There is no guarding and no CVA tenderness.  Musculoskeletal: He exhibits no edema.       Right  shoulder: He exhibits no swelling and no deformity.  Neurological: He is alert and oriented to person, place, and time. He has normal strength and normal reflexes. No cranial nerve deficit or sensory deficit. Gait normal.  Skin: Skin is warm and dry. No rash noted. No cyanosis. Nails show no clubbing.  Psychiatric: He has a normal mood and affect. His speech is normal and behavior is normal. Judgment and thought content normal. Cognition and memory are normal.    Recent Results (from the past 2160 hour(s))  Comprehensive metabolic panel     Status: Abnormal   Collection Time: 02/25/17  7:27 AM  Result Value Ref Range   Sodium 139 135 - 146 mmol/L   Potassium 4.5 3.5 - 5.3 mmol/L   Chloride 104 98 - 110 mmol/L   CO2 26 20 - 31 mmol/L   Glucose, Bld 101 (H) 65 - 99 mg/dL   BUN 13 7 - 25 mg/dL   Creat 0.83 0.60 - 1.35 mg/dL   Total Bilirubin 0.3 0.2 - 1.2 mg/dL   Alkaline Phosphatase 75 40 - 115 U/L   AST 17 10 - 40 U/L  ALT 24 9 - 46 U/L   Total Protein 6.9 6.1 - 8.1 g/dL   Albumin 4.4 3.6 - 5.1 g/dL   Calcium 9.2 8.6 - 10.3 mg/dL  Hemoglobin A1c     Status: Abnormal   Collection Time: 02/25/17  7:27 AM  Result Value Ref Range   Hgb A1c MFr Bld 8.4 (H) <5.7 %    Comment:   For someone without known diabetes, a hemoglobin A1c value of 6.5% or greater indicates that they may have diabetes and this should be confirmed with a follow-up test.   For someone with known diabetes, a value <7% indicates that their diabetes is well controlled and a value greater than or equal to 7% indicates suboptimal control. A1c targets should be individualized based on duration of diabetes, age, comorbid conditions, and other considerations.   Currently, no consensus exists for use of hemoglobin A1c for diagnosis of diabetes for children.      Mean Plasma Glucose 194 mg/dL     Assessment & Plan:   1. Uncontrolled type 2 diabetes mellitus with complication, with long-term current use of  insulin (Justin [name])   He  remains at a high risk for more acute and chronic complications of diabetes which include CAD, CVA, CKD, retinopathy, and neuropathy. These are all discussed in detail with the patient.  Patient came with  near target fasting glucose profile, however his recent A1c is higher at 8.4% from 7.5% during last visit.     Glucose logs and insulin administration records pertaining to this visit,  to be scanned into patient's records.  Recent labs reviewed.   - I have re-counseled the patient on diet management  by adopting a carbohydrate restricted / protein rich  Diet.  - Suggestion is made for patient to avoid simple carbohydrates   from his diet including Cakes , Desserts, Ice Cream,  Soda (  diet and regular) , Sweet Tea , Candies,  Chips, Cookies, Artificial Sweeteners,   and "Sugar-free" Products .  This will help patient to have stable blood glucose profile and potentially avoid unintended  Weight gain.  - Patient is advised to stick to a routine mealtimes to eat 3 meals  a day and avoid unnecessary snacks ( to snack only to correct hypoglycemia).   - I have approached patient with the following individualized plan to manage diabetes and patient agrees.  -   He would benefit from a combination of basal insulin and  incretin therapy once he finishes his current supply of Levemir.  - In the meantime, emphasizing the importance of compliance, I will continue with Levemir  40  units at bedtime blood glucose at least twice a day-before breakfast and at bedtime. - He'll call for a prescription once he finishes his current supply of Levemir. -   He has a habit of skipping the second dose of metformin, I discussed and changed his metformin to [place] which is a combination of metformin and Farxiga  04/999 mg ER extended once a day after breakfast.   - Patient specific target  for A1c; LDL, HDL, Triglycerides, and  Waist Circumference were discussed in detail.  2) BP/HTN:  Controlled. Continue current medications including ACEI/ARB. 3) Lipids/HPL: For  unclear reasons he discontinued his atorvastatin. 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 However patient declined therapy for hyperlipidemia.  He is advised to avoid butter and fried food. - His LDL is still above target at 127. He declines therapy with statins.  4)  Weight/Diet: CDE  consult in progress, exercise, and carbohydrates information provided.  5) Vitamin D deficiency - He is status post therapy with vitamin D 50,000 units weekly for 12 weeks.  6) Chronic Care/Health Maintenance:  -Patient is on ACEI/ARB  medications and encouraged to continue to follow up with Ophthalmology, Podiatrist at least yearly or according to recommendations, and advised to  stay away from smoking. I have recommended yearly flu vaccine and pneumonia vaccination at least every 5 years; moderate intensity exercise for up to 150 minutes weekly; and  sleep for at least 7 hours a day.  I advised patient to maintain close follow up with their PCP for primary care needs.  Patient is asked to bring meter and  blood glucose logs during his next visit.   Follow up plan: Return in about 3 months (around 06/04/2017) for meter, and logs.  Glade Lloyd, MD Phone: [phone]  Fax: [phone]   03/04/2017, 9:19 AM

## 2017-03-04 NOTE — Patient Instructions (Signed)

## 2017-03-21 ENCOUNTER — Encounter (INDEPENDENT_AMBULATORY_CARE_PROVIDER_SITE_OTHER): Payer: Self-pay | Admitting: *Deleted

## 2017-04-02 ENCOUNTER — Other Ambulatory Visit: Payer: Self-pay | Admitting: "Endocrinology

## 2017-04-02 ENCOUNTER — Telehealth: Payer: Self-pay | Admitting: "Endocrinology

## 2017-04-02 MED ORDER — INSULIN DEGLUDEC-LIRAGLUTIDE 100-3.6 UNIT-MG/ML ~~LOC~~ SOPN: 50.0000 [IU] | PEN_INJECTOR | Freq: Every day | SUBCUTANEOUS | 2 refills | Status: DC

## 2017-04-02 NOTE — Telephone Encounter (Signed)
I want to switch to Xultophy 50/1.8 units qAM with breakfast, 24 +  hours after his last  Levemir .

## 2017-04-02 NOTE — Telephone Encounter (Signed)
Justin [name] is calling stating that Dr. Dorris Fetch told him that once he was out of his Levamir that he was going to switch him to something different and he is stating that he is out, please advise?

## 2017-04-03 ENCOUNTER — Other Ambulatory Visit: Payer: Self-pay

## 2017-04-03 MED ORDER — INSULIN DEGLUDEC-LIRAGLUTIDE 100-3.6 UNIT-MG/ML ~~LOC~~ SOPN: 50.0000 [IU] | PEN_INJECTOR | Freq: Every day | SUBCUTANEOUS | 2 refills | Status: DC

## 2017-04-04 ENCOUNTER — Other Ambulatory Visit: Payer: Self-pay

## 2017-04-04 MED ORDER — INSULIN DEGLUDEC-LIRAGLUTIDE 100-3.6 UNIT-MG/ML ~~LOC~~ SOPN: 50.0000 [IU] | PEN_INJECTOR | Freq: Every day | SUBCUTANEOUS | 2 refills | Status: DC

## 2017-05-07 ENCOUNTER — Other Ambulatory Visit: Payer: Self-pay | Admitting: "Endocrinology

## 2017-06-09 ENCOUNTER — Ambulatory Visit: Payer: 59 | Admitting: "Endocrinology

## 2017-06-13 ENCOUNTER — Other Ambulatory Visit: Payer: Self-pay

## 2017-06-13 MED ORDER — INSULIN DEGLUDEC-LIRAGLUTIDE 100-3.6 UNIT-MG/ML ~~LOC~~ SOPN: 50.0000 [IU] | PEN_INJECTOR | Freq: Every day | SUBCUTANEOUS | 0 refills | Status: DC

## 2017-06-14 ENCOUNTER — Other Ambulatory Visit: Payer: Self-pay | Admitting: "Endocrinology

## 2017-06-28 ENCOUNTER — Other Ambulatory Visit: Payer: Self-pay | Admitting: "Endocrinology

## 2017-07-01 LAB — RENAL FUNCTION PANEL
Albumin: 4.2 g/dL (ref 3.6–5.1)
BUN: 14 mg/dL (ref 7–25)
CALCIUM: 9.3 mg/dL (ref 8.6–10.3)
CHLORIDE: 104 mmol/L (ref 98–110)
CO2: 27 mmol/L (ref 20–32)
CREATININE: 1.04 mg/dL (ref 0.60–1.35)
Glucose, Bld: 115 mg/dL — ABNORMAL HIGH (ref 65–99)
POTASSIUM: 4.2 mmol/L (ref 3.5–5.3)
Phosphorus: 3.6 mg/dL (ref 2.5–4.5)
SODIUM: 140 mmol/L (ref 135–146)

## 2017-07-01 LAB — HEMOGLOBIN A1C
Hgb A1c MFr Bld: 6.9 % of total Hgb — ABNORMAL HIGH (ref <5.7)
Mean Plasma Glucose: 151 (calc)
eAG (mmol/L): 8.4 (calc)

## 2017-07-08 ENCOUNTER — Ambulatory Visit: Payer: 59 | Admitting: "Endocrinology

## 2017-07-08 ENCOUNTER — Encounter: Payer: Self-pay | Admitting: "Endocrinology

## 2017-07-08 ENCOUNTER — Ambulatory Visit: Payer: Self-pay | Admitting: "Endocrinology

## 2017-07-08 VITALS — BP 148/91 | HR 103 | Ht 65.0 in | Wt 169.0 lb

## 2017-07-08 DIAGNOSIS — E118 Type 2 diabetes mellitus with unspecified complications: Secondary | ICD-10-CM | POA: Diagnosis not present

## 2017-07-08 DIAGNOSIS — E559 Vitamin D deficiency, unspecified: Secondary | ICD-10-CM | POA: Diagnosis not present

## 2017-07-08 DIAGNOSIS — Z794 Long term (current) use of insulin: Secondary | ICD-10-CM | POA: Diagnosis not present

## 2017-07-08 DIAGNOSIS — E1165 Type 2 diabetes mellitus with hyperglycemia: Secondary | ICD-10-CM

## 2017-07-08 DIAGNOSIS — I10 Essential (primary) hypertension: Secondary | ICD-10-CM | POA: Diagnosis not present

## 2017-07-08 DIAGNOSIS — IMO0002 Reserved for concepts with insufficient information to code with codable children: Secondary | ICD-10-CM

## 2017-07-08 DIAGNOSIS — E7849 Other hyperlipidemia: Secondary | ICD-10-CM

## 2017-07-08 MED ORDER — METFORMIN HCL 1000 MG PO TABS: 1000.0000 mg | ORAL_TABLET | Freq: Two times a day (BID) | ORAL | 3 refills | Status: DC

## 2017-07-08 NOTE — Patient Instructions (Signed)

## 2017-07-08 NOTE — Progress Notes (Signed)
Subjective:    Patient ID: Justin [name], male    DOB: Jan 08, 1971,    Past Medical History:  Diagnosis Date  . Diabetes mellitus ([place])    Type 2 x 9 yrs.  . Hyperlipidemia   . Hypertension    x 5 yrs   Past Surgical History:  Procedure Laterality Date  .  rt knee arthroscopy    . COLONOSCOPY  04/10/2012   Procedure: COLONOSCOPY;  Surgeon: Rogene Houston, MD;  Location: [facility];  Service: Endoscopy;  Laterality: N/A;  1200   Social History   Socioeconomic History  . Marital status: Married    Spouse name: None  . Number of children: None  . Years of education: None  . Highest education level: None  Social Needs  . Financial resource strain: None  . Food insecurity - worry: None  . Food insecurity - inability: None  . Transportation needs - medical: None  . Transportation needs - non-medical: None  Occupational History  . None  Tobacco Use  . Smoking status: Current Every Day Smoker  . Smokeless tobacco: Never Used  . Tobacco comment: Cigar a day  Substance and Sexual Activity  . Alcohol use: Yes    Comment: 6 pack a week  . Drug use: No  . Sexual activity: None  Other Topics Concern  . None  Social History Narrative  . None   Outpatient Encounter Medications as of 07/08/2017  Medication Sig  . amLODipine (NORVASC) 2.5 MG tablet Take 2.5 mg by mouth daily.  . BD PEN NEEDLE NANO U/F 32G X 4 MM MISC USE AS DIRECTED TWICE DAILY  . benazepril (LOTENSIN) 40 MG tablet TAKE 1 TABLET (40 MG TOTAL) BY MOUTH DAILY. (Patient not taking: Reported on 07/08/2017)  . Insulin Degludec-Liraglutide (XULTOPHY) 100-3.6 UNIT-MG/ML SOPN Inject 50 Units daily with breakfast into the skin.  . metFORMIN (GLUCOPHAGE) 1000 MG tablet Take 1 tablet (1,000 mg total) by mouth 2 (two) times daily with a meal.  . [DISCONTINUED] XIGDUO XR 04-999 MG TB24 TAKE 1 TABLET BY MOUTH DAILY AFTER BREAKFAST.  . [DISCONTINUED] XULTOPHY 100-3.6 UNIT-MG/ML SOPN INJECT 50 UNITS INTO THE SKIN DAILY  WITH BREAKFAST.   No facility-administered encounter medications on file as of 07/08/2017.    ALLERGIES: Allergies  Allergen Reactions  . Codeine     Hives, itching    VACCINATION STATUS:  There is no immunization history on file for this patient.  Diabetes  He presents for his follow-up diabetic visit. He has type 2 diabetes mellitus. Onset time: He was diagnosed at approximate age of 24 years. His disease course has been improving. There are no hypoglycemic associated symptoms. Pertinent negatives for hypoglycemia include no confusion, headaches, pallor or seizures. There are no diabetic associated symptoms. Pertinent negatives for diabetes include no fatigue, no polydipsia, no polyphagia, no polyuria and no weakness. There are no hypoglycemic complications. Symptoms are improving. There are no diabetic complications. Risk factors for coronary artery disease include diabetes mellitus, dyslipidemia, hypertension and male sex. Current diabetic treatment includes insulin injections and oral agent (dual therapy). He is compliant with treatment most of the time. His weight is increasing steadily. He is following a generally unhealthy diet. He participates in exercise intermittently. Blood glucose monitoring compliance is inadequate. His home blood glucose trend is increasing steadily. His breakfast blood glucose range is generally 140-180 mg/dl. His overall blood glucose range is 140-180 mg/dl. (He is not monitoring adequately. ) An ACE inhibitor/angiotensin II receptor  blocker is being taken. Eye exam is not current.  Hypertension  This is a chronic problem. The current episode started more than 1 year ago. Pertinent negatives include no headaches, neck pain or palpitations. Past treatments include ACE inhibitors. The current treatment provides moderate improvement. Compliance problems include diet.   Hyperlipidemia  This is a chronic problem. The current episode started more than 1 year ago.  Treatments tried: He was supposed to be on atorvastatin, however for unclear reasons patient stopped his medication. Risk factors for coronary artery disease include dyslipidemia, diabetes mellitus, hypertension and male sex.    Review of Systems  Constitutional: Negative for fatigue and unexpected weight change.  HENT: Negative for dental problem, mouth sores and trouble swallowing.   Eyes: Negative for visual disturbance.  Respiratory: Negative for cough, choking, chest tightness and wheezing.   Cardiovascular: Negative for palpitations and leg swelling.  Gastrointestinal: Negative for abdominal distention, abdominal pain, constipation, diarrhea, nausea and vomiting.  Endocrine: Negative for polydipsia, polyphagia and polyuria.  Genitourinary: Negative for dysuria, flank pain, hematuria and urgency.  Musculoskeletal: Negative for back pain, gait problem and neck pain.  Skin: Negative for pallor, rash and wound.  Neurological: Negative for seizures, syncope, weakness, numbness and headaches.  Psychiatric/Behavioral: Negative.  Negative for confusion and dysphoric mood.     Objective:    BP (!) 148/91   Pulse (!) 103   Ht 5\' 5"  (1.651 m)   Wt 169 lb (76.7 kg)   BMI 28.12 kg/m   Wt Readings from Last 3 Encounters:  07/08/17 169 lb (76.7 kg)  03/04/17 162 lb (73.5 kg)  09/23/16 162 lb (73.5 kg)    Physical Exam  Constitutional: He is oriented to person, place, and time. He appears well-developed and well-nourished. He is cooperative. No distress.  HENT:  Head: Normocephalic and atraumatic.  Eyes: EOM are normal.  Neck: Normal range of motion. Neck supple. No tracheal deviation present. No thyromegaly present.  Cardiovascular: Normal rate, S1 normal, S2 normal and normal heart sounds. Exam reveals no gallop.  No murmur heard. Pulses:      Dorsalis pedis pulses are 1+ on the right side, and 1+ on the left side.       Posterior tibial pulses are 1+ on the right side, and 1+ on  the left side.  Pulmonary/Chest: Breath sounds normal. No respiratory distress. He has no wheezes.  Abdominal: Soft. Bowel sounds are normal. He exhibits no distension. There is no tenderness. There is no guarding and no CVA tenderness.  Musculoskeletal: He exhibits no edema.       Right shoulder: He exhibits no swelling and no deformity.  Neurological: He is alert and oriented to person, place, and time. He has normal strength and normal reflexes. No cranial nerve deficit or sensory deficit. Gait normal.  Skin: Skin is warm and dry. No rash noted. No cyanosis. Nails show no clubbing.  Psychiatric: He has a normal mood and affect. His speech is normal and behavior is normal. Judgment and thought content normal. Cognition and memory are normal.    Recent Results (from the past 2160 hour(s))  Renal function panel     Status: Abnormal   Collection Time: 06/30/17  7:08 AM  Result Value Ref Range   Glucose, Bld 115 (H) 65 - 99 mg/dL    Comment: .            Fasting reference interval . For someone without known diabetes, a glucose value between 100 and 125  mg/dL is consistent with prediabetes and should be confirmed with a follow-up test. .    BUN 14 7 - 25 mg/dL   Creat 1.04 0.60 - 1.35 mg/dL   BUN/Creatinine Ratio NOT APPLICABLE 6 - 22 (calc)   Sodium 140 135 - 146 mmol/L   Potassium 4.2 3.5 - 5.3 mmol/L   Chloride 104 98 - 110 mmol/L   CO2 27 20 - 32 mmol/L   Calcium 9.3 8.6 - 10.3 mg/dL   Phosphorus 3.6 2.5 - 4.5 mg/dL   Albumin 4.2 3.6 - 5.1 g/dL  Hemoglobin A1c     Status: Abnormal   Collection Time: 06/30/17  7:08 AM  Result Value Ref Range   Hgb A1c MFr Bld 6.9 (H) <5.7 % of total Hgb    Comment: For someone without known diabetes, a hemoglobin A1c value of 6.5% or greater indicates that they may have  diabetes and this should be confirmed with a follow-up  test. . For someone with known diabetes, a value <7% indicates  that their diabetes is well controlled and a value   greater than or equal to 7% indicates suboptimal  control. A1c targets should be individualized based on  duration of diabetes, age, comorbid conditions, and  other considerations. . Currently, no consensus exists regarding use of hemoglobin A1c for diagnosis of diabetes for children. .    Mean Plasma Glucose 151 (calc)   eAG (mmol/L) 8.4 (calc)     Assessment & Plan:   1. Uncontrolled type 2 diabetes mellitus with complication, with long-term current use of insulin ([place])   He  remains at a high risk for more acute and chronic complications of diabetes which include CAD, CVA, CKD, retinopathy, and neuropathy. These are all discussed in detail with the patient.  Patient came with better and near target fasting glucose profile, however , he is not monitoring adequately. - His A1c has improved to 6.9% from 8.4% during last visit.    Glucose logs and insulin administration records pertaining to this visit,  to be scanned into patient's records.  Recent labs reviewed.   - I have re-counseled the patient on diet management  by adopting a carbohydrate restricted / protein rich  Diet.  -  Suggestion is made for him to avoid simple carbohydrates  from his diet including Cakes, Sweet Desserts / Pastries, Ice Cream, Soda (diet and regular), Sweet Tea, Candies, Chips, Cookies, Store Bought Juices, Alcohol in Excess of  1-2 drinks a day, Artificial Sweeteners, and "Sugar-free" Products. This will help patient to have stable blood glucose profile and potentially avoid unintended weight gain.   - Patient is advised to stick to a routine mealtimes to eat 3 meals  a day and avoid unnecessary snacks ( to snack only to correct hypoglycemia).   - I have approached patient with the following individualized plan to manage diabetes and patient agrees.  - I advised him to continue Xultophy 40 units daily with breakfast associated with strict monitoring of blood glucose 2 times a day-daily before  breakfast and at bedtime.  -  I advised him to discontinue Xigduo after he finishes his current supplies. - He will resume metformin 1000 g by mouth twice a day after meals.  - Patient specific target  for A1c; LDL, HDL, Triglycerides, and  Waist Circumference were discussed in detail.  2) BP/HTN: uncontrolled. Continue current medications including ACEI/ARB. 3) Lipids/HPL: For  unclear reasons he discontinued his atorvastatin. 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 However patient  declined therapy for hyperlipidemia.  He is advised to avoid butter and fried food. - His LDL is still above target at 127. He declines therapy with statins.  4)  Weight/Diet: CDE consult in progress, exercise, and carbohydrates information provided.  5) Vitamin D deficiency - He is status post therapy with vitamin D 50,000 units weekly for 12 weeks.  6) Chronic Care/Health Maintenance:  -Patient is on ACEI/ARB  medications and encouraged to continue to follow up with Ophthalmology, Podiatrist at least yearly or according to recommendations, and advised to  stay away from smoking. I have recommended yearly flu vaccine and pneumonia vaccination at least every 5 years; moderate intensity exercise for up to 150 minutes weekly; and  sleep for at least 7 hours a day.  I advised patient to maintain close follow up with his PCP for primary care needs.  - Time spent with the patient: 25 min, of which >50% was spent in reviewing his sugar logs , discussing his hypo- and hyper-glycemic episodes, reviewing his current and  previous labs and insulin doses and developing a plan to avoid hypo- and hyper-glycemia.   Follow up plan: Return in about 3 months (around 10/06/2017) for follow up with pre-visit labs, meter, and logs.  Glade Lloyd, MD Phone: [phone]  Fax: [phone]   -  This note was partially dictated with voice recognition software. Similar sounding words can be transcribed inadequately or may not  be  corrected upon review.  07/08/2017, 12:51 PM

## 2017-08-19 DIAGNOSIS — E785 Hyperlipidemia, unspecified: Secondary | ICD-10-CM | POA: Diagnosis not present

## 2017-08-19 DIAGNOSIS — E119 Type 2 diabetes mellitus without complications: Secondary | ICD-10-CM | POA: Diagnosis not present

## 2017-08-19 DIAGNOSIS — I10 Essential (primary) hypertension: Secondary | ICD-10-CM | POA: Diagnosis not present

## 2017-08-19 DIAGNOSIS — Z0000 Encounter for general adult medical examination without abnormal findings: Secondary | ICD-10-CM | POA: Diagnosis not present

## 2017-09-29 DIAGNOSIS — E119 Type 2 diabetes mellitus without complications: Secondary | ICD-10-CM | POA: Diagnosis not present

## 2017-09-29 DIAGNOSIS — I10 Essential (primary) hypertension: Secondary | ICD-10-CM | POA: Diagnosis not present

## 2017-09-29 DIAGNOSIS — E559 Vitamin D deficiency, unspecified: Secondary | ICD-10-CM | POA: Diagnosis not present

## 2017-10-05 ENCOUNTER — Other Ambulatory Visit: Payer: Self-pay | Admitting: "Endocrinology

## 2017-10-06 ENCOUNTER — Ambulatory Visit: Payer: 59 | Admitting: "Endocrinology

## 2017-11-08 ENCOUNTER — Other Ambulatory Visit: Payer: Self-pay | Admitting: "Endocrinology

## 2017-11-10 DIAGNOSIS — I10 Essential (primary) hypertension: Secondary | ICD-10-CM | POA: Diagnosis not present

## 2017-11-10 DIAGNOSIS — E559 Vitamin D deficiency, unspecified: Secondary | ICD-10-CM | POA: Diagnosis not present

## 2017-11-18 ENCOUNTER — Other Ambulatory Visit: Payer: Self-pay | Admitting: "Endocrinology

## 2018-01-13 ENCOUNTER — Other Ambulatory Visit: Payer: Self-pay | Admitting: "Endocrinology

## 2018-02-06 ENCOUNTER — Other Ambulatory Visit: Payer: Self-pay | Admitting: "Endocrinology

## 2018-02-09 DIAGNOSIS — I10 Essential (primary) hypertension: Secondary | ICD-10-CM | POA: Diagnosis not present

## 2018-02-09 DIAGNOSIS — E119 Type 2 diabetes mellitus without complications: Secondary | ICD-10-CM | POA: Diagnosis not present

## 2018-02-09 DIAGNOSIS — E785 Hyperlipidemia, unspecified: Secondary | ICD-10-CM | POA: Diagnosis not present

## 2018-03-11 ENCOUNTER — Other Ambulatory Visit: Payer: Self-pay | Admitting: "Endocrinology

## 2018-03-14 ENCOUNTER — Other Ambulatory Visit: Payer: Self-pay | Admitting: "Endocrinology

## 2018-04-06 DIAGNOSIS — I10 Essential (primary) hypertension: Secondary | ICD-10-CM | POA: Diagnosis not present

## 2018-04-06 DIAGNOSIS — E559 Vitamin D deficiency, unspecified: Secondary | ICD-10-CM | POA: Diagnosis not present

## 2018-04-06 DIAGNOSIS — E785 Hyperlipidemia, unspecified: Secondary | ICD-10-CM | POA: Diagnosis not present

## 2018-04-06 DIAGNOSIS — E119 Type 2 diabetes mellitus without complications: Secondary | ICD-10-CM | POA: Diagnosis not present

## 2018-04-17 ENCOUNTER — Other Ambulatory Visit: Payer: Self-pay | Admitting: "Endocrinology

## 2018-05-10 ENCOUNTER — Other Ambulatory Visit: Payer: Self-pay | Admitting: "Endocrinology

## 2018-05-21 ENCOUNTER — Other Ambulatory Visit: Payer: Self-pay | Admitting: "Endocrinology

## 2018-06-01 DIAGNOSIS — E785 Hyperlipidemia, unspecified: Secondary | ICD-10-CM | POA: Diagnosis not present

## 2018-06-01 DIAGNOSIS — E119 Type 2 diabetes mellitus without complications: Secondary | ICD-10-CM | POA: Diagnosis not present

## 2018-06-01 DIAGNOSIS — I10 Essential (primary) hypertension: Secondary | ICD-10-CM | POA: Diagnosis not present

## 2018-07-13 DIAGNOSIS — E1165 Type 2 diabetes mellitus with hyperglycemia: Secondary | ICD-10-CM | POA: Diagnosis not present

## 2018-07-13 DIAGNOSIS — I10 Essential (primary) hypertension: Secondary | ICD-10-CM | POA: Diagnosis not present

## 2018-07-13 DIAGNOSIS — E785 Hyperlipidemia, unspecified: Secondary | ICD-10-CM | POA: Diagnosis not present

## 2018-07-13 DIAGNOSIS — E119 Type 2 diabetes mellitus without complications: Secondary | ICD-10-CM | POA: Diagnosis not present

## 2018-08-31 DIAGNOSIS — E1165 Type 2 diabetes mellitus with hyperglycemia: Secondary | ICD-10-CM | POA: Diagnosis not present

## 2018-08-31 DIAGNOSIS — E785 Hyperlipidemia, unspecified: Secondary | ICD-10-CM | POA: Diagnosis not present

## 2018-08-31 DIAGNOSIS — I10 Essential (primary) hypertension: Secondary | ICD-10-CM | POA: Diagnosis not present

## 2018-10-19 ENCOUNTER — Encounter (INDEPENDENT_AMBULATORY_CARE_PROVIDER_SITE_OTHER): Payer: Self-pay | Admitting: Nurse Practitioner

## 2018-10-26 DIAGNOSIS — E1165 Type 2 diabetes mellitus with hyperglycemia: Secondary | ICD-10-CM | POA: Diagnosis not present

## 2018-10-26 DIAGNOSIS — E559 Vitamin D deficiency, unspecified: Secondary | ICD-10-CM | POA: Diagnosis not present

## 2018-10-26 DIAGNOSIS — R109 Unspecified abdominal pain: Secondary | ICD-10-CM | POA: Diagnosis not present

## 2018-11-02 DIAGNOSIS — E1165 Type 2 diabetes mellitus with hyperglycemia: Secondary | ICD-10-CM | POA: Diagnosis not present

## 2018-11-02 DIAGNOSIS — I10 Essential (primary) hypertension: Secondary | ICD-10-CM | POA: Diagnosis not present

## 2018-12-10 DIAGNOSIS — E785 Hyperlipidemia, unspecified: Secondary | ICD-10-CM | POA: Diagnosis not present

## 2018-12-10 DIAGNOSIS — E1165 Type 2 diabetes mellitus with hyperglycemia: Secondary | ICD-10-CM | POA: Diagnosis not present

## 2018-12-10 DIAGNOSIS — R109 Unspecified abdominal pain: Secondary | ICD-10-CM | POA: Diagnosis not present

## 2019-04-19 ENCOUNTER — Other Ambulatory Visit (INDEPENDENT_AMBULATORY_CARE_PROVIDER_SITE_OTHER): Payer: Self-pay | Admitting: Internal Medicine

## 2019-04-19 ENCOUNTER — Telehealth (INDEPENDENT_AMBULATORY_CARE_PROVIDER_SITE_OTHER): Payer: Self-pay | Admitting: Internal Medicine

## 2019-04-19 MED ORDER — METFORMIN HCL 1000 MG PO TABS: 1000.0000 mg | ORAL_TABLET | Freq: Two times a day (BID) | ORAL | 0 refills | Status: DC

## 2019-04-19 MED ORDER — JARDIANCE 10 MG PO TABS: 10.0000 mg | ORAL_TABLET | Freq: Every day | ORAL | 0 refills | Status: DC

## 2019-04-19 MED ORDER — GLIPIZIDE 5 MG PO TABS
5.0000 mg | ORAL_TABLET | Freq: Two times a day (BID) | ORAL | 0 refills | Status: DC
Start: 2019-04-19 — End: 2019-09-21

## 2019-04-19 NOTE — Telephone Encounter (Signed)
Done

## 2019-05-18 ENCOUNTER — Other Ambulatory Visit (INDEPENDENT_AMBULATORY_CARE_PROVIDER_SITE_OTHER): Payer: Self-pay | Admitting: Internal Medicine

## 2019-05-18 ENCOUNTER — Telehealth (INDEPENDENT_AMBULATORY_CARE_PROVIDER_SITE_OTHER): Payer: Self-pay

## 2019-05-18 MED ORDER — BENAZEPRIL HCL 40 MG PO TABS: 40.0000 mg | ORAL_TABLET | Freq: Every day | ORAL | 1 refills | Status: DC

## 2019-05-19 NOTE — Telephone Encounter (Signed)
Medication was sent to pharmacy.

## 2019-06-13 ENCOUNTER — Other Ambulatory Visit (INDEPENDENT_AMBULATORY_CARE_PROVIDER_SITE_OTHER): Payer: Self-pay | Admitting: Nurse Practitioner

## 2019-06-21 ENCOUNTER — Ambulatory Visit (INDEPENDENT_AMBULATORY_CARE_PROVIDER_SITE_OTHER): Payer: 59 | Admitting: Internal Medicine

## 2019-06-21 ENCOUNTER — Encounter (INDEPENDENT_AMBULATORY_CARE_PROVIDER_SITE_OTHER): Payer: Self-pay | Admitting: Internal Medicine

## 2019-06-21 ENCOUNTER — Other Ambulatory Visit: Payer: Self-pay

## 2019-06-21 VITALS — BP 120/90 | HR 78 | Temp 97.6°F | Resp 98 | Ht 65.0 in | Wt 151.8 lb

## 2019-06-21 DIAGNOSIS — IMO0002 Reserved for concepts with insufficient information to code with codable children: Secondary | ICD-10-CM

## 2019-06-21 DIAGNOSIS — E7849 Other hyperlipidemia: Secondary | ICD-10-CM | POA: Diagnosis not present

## 2019-06-21 DIAGNOSIS — E1165 Type 2 diabetes mellitus with hyperglycemia: Secondary | ICD-10-CM

## 2019-06-21 DIAGNOSIS — I10 Essential (primary) hypertension: Secondary | ICD-10-CM | POA: Diagnosis not present

## 2019-06-21 DIAGNOSIS — E559 Vitamin D deficiency, unspecified: Secondary | ICD-10-CM

## 2019-06-21 DIAGNOSIS — E118 Type 2 diabetes mellitus with unspecified complications: Secondary | ICD-10-CM | POA: Diagnosis not present

## 2019-06-21 DIAGNOSIS — Z794 Long term (current) use of insulin: Secondary | ICD-10-CM

## 2019-06-21 NOTE — Patient Instructions (Signed)
[facility] Dietary Recommendations for Weight Loss What to Avoid . Avoid added sugars o Often added sugar can be found in processed foods such as many condiments, dry cereals, cakes, cookies, chips, crisps, crackers, candies, sweetened drinks, etc.  o Read labels and AVOID/DECREASE use of foods with the following in their ingredient list: Sugar, fructose, high fructose corn syrup, sucrose, glucose, maltose, dextrose, molasses, cane sugar, brown sugar, any type of syrup, agave nectar, etc.   . Avoid snacking in between meals . Avoid foods made with flour o If you are going to eat food made with flour, choose those made with whole-grains; and, minimize your consumption as much as is tolerable . Avoid processed foods o These foods are generally stocked in the middle of the grocery store. Focus on shopping on the perimeter of the grocery.  What to Include . Vegetables o GREEN LEAFY VEGETABLES: Kale, spinach, mustard greens, collard greens, cabbage, broccoli, etc. o OTHER: Asparagus, cauliflower, eggplant, carrots, peas, Brussel sprouts, tomatoes, bell peppers, zucchini, beets, cucumbers, etc. . Grains, seeds, and legumes o Beans: kidney beans, black eyed peas, garbanzo beans, black beans, pinto beans, etc. o Whole, unrefined grains: brown rice, barley, bulgur, oatmeal, etc. . Healthy fats  o Avoid highly processed fats such as vegetable oil o Examples of healthy fats: avocado, olives, virgin olive oil, dark chocolate (?72% Cocoa), nuts (peanuts, almonds, walnuts, cashews, pecans, etc.) . Low - Moderate Intake of Animal Sources of Protein o Meat sources: chicken, turkey, salmon, tuna. Limit to 4 ounces of meat at one time. o Consider limiting dairy sources, but when choosing dairy focus on: PLAIN Greek yogurt, cottage cheese, high-protein milk . Fruit o Choose berries  When to Eat . Intermittent Fasting: o Choosing not to eat for a specific time period, but DO FOCUS ON HYDRATION  when fasting o Multiple Techniques: - Time Restricted Eating: eat 3 meals in a day, each meal lasting no more than 60 minutes, no snacks between meals - 16-18 hour fast: fast for 16 to 18 hours up to 7 days a week. Often suggested to start with 2-3 nonconsecutive days per week.  . Remember the time you sleep is counted as fasting.  . Examples of eating schedule: Fast from 7:00pm-11:00am. Eat between 11:00am-7:00pm.  - 24-hour fast: fast for 24 hours up to every other day. Often suggested to start with 1 day per week . Remember the time you sleep is counted as fasting . Examples of eating schedule:  o Eating day: eat 2-3 meals on your eating day. If doing 2 meals, each meal should last no more than 90 minutes. If doing 3 meals, each meal should last no more than 60 minutes. Finish last meal by 7:00pm. o Fasting day: Fast until 7:00pm.  o IF YOU FEEL UNWELL FOR ANY REASON/IN ANY WAY WHEN FASTING, STOP FASTING BY EATING A NUTRITIOUS SNACK OR LIGHT MEAL o ALWAYS FOCUS ON HYDRATION DURING FASTS - Acceptable Hydration sources: water, broths, tea/coffee (black tea/coffee is best but using a small amount of whole-fat dairy products in coffee/tea is acceptable).  - Poor Hydration Sources: anything with sugar or artificial sweeteners added to it  These recommendations have been developed for patients that are actively receiving medical care from either Dr. Kewanna Kasprzak or Sarah Gray, DNP, NP-C at [facility]. These recommendations are developed for patients with specific medical conditions and are not meant to be distributed or used by others that are not actively receiving care from either provider listed   above at [facility]. It is not appropriate to participate in the above eating plans without proper medical supervision.   Reference: Fung, J. The obesity code. [place]: [place]; 2016.   

## 2019-06-21 NOTE — Progress Notes (Signed)
Metrics: Intervention Frequency ACO  Documented Smoking Status Yearly  Screened one or more times in 24 months  Cessation Counseling or  Active cessation medication Past 24 months  Past 24 months   Guideline developer: UpToDate (See UpToDate for funding source) Date Released: 2014       Wellness Office Visit  Subjective:  Patient ID: Justin [name], male    DOB: [birth year]  Age: 48 y.o. MRN: [id number]  CC: This man comes in for follow-up of uncontrolled diabetes, hyperlipidemia, hypertension and vitamin D deficiency. HPI  Unfortunately, he has not been very consistent with nutrition and he finds himself eating junk food again.  Since he is working from home, he has not been fasting on a regular basis. He continues on several medications for his diabetes and his last hemoglobin A1c showed poor control.  Thankfully, he has not really had any complications of diabetes. He also has hypertension and is cut back on his amlodipine because the higher dose was making him feel somewhat groggy.  He denies any chest pain, dyspnea, palpitations or limb weakness. He did have vitamin D deficiency but he has discontinued taking all vitamin D supplementation. Past Medical History:  Diagnosis Date  . Diabetes mellitus ([place])    Type 2 x 9 yrs.  . Hyperlipidemia   . Hypertension    x 5 yrs      History reviewed. No pertinent family history.  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 Smoking status: Current Every Day Smoker  . Smokeless tobacco: Never Used  . Tobacco comment: Cigar a day  Substance Use Topics  . Alcohol use: Yes    Comment: 6 pack a week    Current Meds  Medication Sig  . amLODipine (NORVASC) 5 MG tablet TAKE 2 TABLETS BY MOUTH EVERY DAY (Patient taking differently: Take 5 mg by mouth daily. )  . BD PEN NEEDLE NANO U/F 32G X 4 MM MISC USE AS DIRECTED TWICE DAILY  .  benazepril (LOTENSIN) 40 MG tablet Take 1 tablet (40 mg total) by mouth daily.  . Dulaglutide (TRULICITY) 1.5 WU/9.8JX SOPN Inject 1.5 mg into the skin once a week.   . empagliflozin (JARDIANCE) 10 MG TABS tablet Take 10 mg by mouth daily before breakfast.  . glipiZIDE (GLUCOTROL) 5 MG tablet Take 1 tablet (5 mg total) by mouth 2 (two) times daily before a meal.  . insulin glargine (LANTUS) 100 UNIT/ML injection Inject 20 Units into the skin daily.  . metFORMIN (GLUCOPHAGE) 1000 MG tablet Take 1 tablet (1,000 mg total) by mouth 2 (two) times daily with a meal. (Patient taking differently: Take 1,000 mg by mouth daily with breakfast. )       Objective:   Today's Vitals: BP 120/90 (BP Location: Left Arm, Patient Position: Sitting, Cuff Size: Normal)   Pulse 78   Temp 97.6 F (36.4 C) (Temporal)   Resp (!) 98   Ht _0  (1.651 m)   Wt 151 lb 12.8 oz (68.9 kg)   BMI 25.26 kg/m  Vitals with BMI 06/21/2019 07/08/2017 03/04/2017  Height _1  _2  -  Weight 151 lbs 13 oz 169 lbs 162 lbs  BMI 91.47 82.95 -  Systolic 621 308 657  Diastolic 90 91 90  Pulse 78 103 83     Physical Exam   He looks systemically well.  Diastolic blood pressure slightly elevated today.  He has gained about 3 pounds since the last time I saw him.  He is alert and orientated without any focal neurological signs.    Assessment   1. Essential hypertension, benign   2. Uncontrolled type 2 diabetes mellitus with complication, with long-term current use of insulin ([place])   3. Vitamin D deficiency   4. Other hyperlipidemia       Tests ordered Orders Placed This Encounter  Procedures  . CMP with eGFR([facility])  . Hemoglobin A1c  . Lipid Panel     Plan: 1. He will continue with oral hypoglycemic agents for his diabetes and we will see what the A1c is. 2. He will continue with the current dose of amlodipine, I think we can afford to continue with 5 mg daily and watch his blood pressure closely. 3. I have  encouraged him to take vitamin D3 10,000 units daily. 4. We will check a lipid panel. 5. He refused/declined influenza and pneumococcal vaccination today. 6. Further recommendations will depend on blood results and I will see him in 3 months time for follow-up.  Emphasized the importance of diet to help his diabetes and gave him a diet sheet today.   No orders of the defined types were placed in this encounter.   Doree Albee, MD

## 2019-06-22 LAB — LIPID PANEL
Cholesterol: 199 mg/dL (ref <200)
HDL: 62 mg/dL (ref >=40)
LDL Cholesterol (Calc): 113 mg/dL (calc) — ABNORMAL HIGH
Non-HDL Cholesterol (Calc): 137 mg/dL (calc) — ABNORMAL HIGH (ref <130)
Total CHOL/HDL Ratio: 3.2 (calc) (ref <5.0)
Triglycerides: 129 mg/dL (ref <150)

## 2019-06-22 LAB — HEMOGLOBIN A1C
Hgb A1c MFr Bld: 8.7 % of total Hgb — ABNORMAL HIGH (ref <5.7)
Mean Plasma Glucose: 203 (calc)
eAG (mmol/L): 11.2 (calc)

## 2019-06-22 LAB — COMPLETE METABOLIC PANEL WITH GFR
AG Ratio: 1.8 (calc) (ref 1.0–2.5)
ALT: 16 U/L (ref 9–46)
AST: 15 U/L (ref 10–40)
Albumin: 4.8 g/dL (ref 3.6–5.1)
Alkaline phosphatase (APISO): 75 U/L (ref 36–130)
BUN: 12 mg/dL (ref 7–25)
CO2: 27 mmol/L (ref 20–32)
Calcium: 10.2 mg/dL (ref 8.6–10.3)
Chloride: 103 mmol/L (ref 98–110)
Creat: 0.94 mg/dL (ref 0.60–1.35)
GFR, Est African American: 111 mL/min/{1.73_m2} (ref >=60)
GFR, Est Non African American: 95 mL/min/{1.73_m2} (ref >=60)
Globulin: 2.7 g/dL (calc) (ref 1.9–3.7)
Glucose, Bld: 168 mg/dL — ABNORMAL HIGH (ref 65–99)
Potassium: 4.7 mmol/L (ref 3.5–5.3)
Sodium: 140 mmol/L (ref 135–146)
Total Bilirubin: 0.4 mg/dL (ref 0.2–1.2)
Total Protein: 7.5 g/dL (ref 6.1–8.1)

## 2019-06-28 ENCOUNTER — Encounter (INDEPENDENT_AMBULATORY_CARE_PROVIDER_SITE_OTHER): Payer: Self-pay | Admitting: Internal Medicine

## 2019-07-13 ENCOUNTER — Other Ambulatory Visit (INDEPENDENT_AMBULATORY_CARE_PROVIDER_SITE_OTHER): Payer: Self-pay | Admitting: Internal Medicine

## 2019-09-21 ENCOUNTER — Other Ambulatory Visit (INDEPENDENT_AMBULATORY_CARE_PROVIDER_SITE_OTHER): Payer: Self-pay | Admitting: Internal Medicine

## 2019-09-27 ENCOUNTER — Ambulatory Visit (INDEPENDENT_AMBULATORY_CARE_PROVIDER_SITE_OTHER): Payer: 59 | Admitting: Internal Medicine

## 2019-10-11 ENCOUNTER — Other Ambulatory Visit: Payer: Self-pay

## 2019-10-11 ENCOUNTER — Encounter (INDEPENDENT_AMBULATORY_CARE_PROVIDER_SITE_OTHER): Payer: Self-pay | Admitting: Internal Medicine

## 2019-10-11 ENCOUNTER — Ambulatory Visit (INDEPENDENT_AMBULATORY_CARE_PROVIDER_SITE_OTHER): Payer: 59 | Admitting: Internal Medicine

## 2019-10-11 VITALS — BP 140/82 | HR 94 | Temp 97.6°F | Resp 18 | Ht 65.0 in | Wt 154.2 lb

## 2019-10-11 DIAGNOSIS — E7849 Other hyperlipidemia: Secondary | ICD-10-CM | POA: Diagnosis not present

## 2019-10-11 DIAGNOSIS — Z794 Long term (current) use of insulin: Secondary | ICD-10-CM

## 2019-10-11 DIAGNOSIS — E118 Type 2 diabetes mellitus with unspecified complications: Secondary | ICD-10-CM | POA: Diagnosis not present

## 2019-10-11 DIAGNOSIS — E1165 Type 2 diabetes mellitus with hyperglycemia: Secondary | ICD-10-CM

## 2019-10-11 DIAGNOSIS — E559 Vitamin D deficiency, unspecified: Secondary | ICD-10-CM | POA: Diagnosis not present

## 2019-10-11 DIAGNOSIS — I10 Essential (primary) hypertension: Secondary | ICD-10-CM | POA: Diagnosis not present

## 2019-10-11 DIAGNOSIS — IMO0002 Reserved for concepts with insufficient information to code with codable children: Secondary | ICD-10-CM

## 2019-10-11 MED ORDER — TRULICITY 1.5 MG/0.5ML ~~LOC~~ SOAJ: 1.5000 mg | SUBCUTANEOUS | 3 refills | Status: DC

## 2019-10-11 MED ORDER — BENAZEPRIL HCL 40 MG PO TABS: 40.0000 mg | ORAL_TABLET | Freq: Every day | ORAL | 0 refills | Status: DC

## 2019-10-11 NOTE — Progress Notes (Signed)
Metrics: Intervention Frequency ACO  Documented Smoking Status Yearly  Screened one or more times in 24 months  Cessation Counseling or  Active cessation medication Past 24 months  Past 24 months   Guideline developer: UpToDate (See UpToDate for funding source) Date Released: 2014       Wellness Office Visit  Subjective:  Patient ID: Justin [name], male    DOB: Jun 17, 1971  Age: 49 y.o. MRN: [id number]  CC: This man comes in for follow-up of uncontrolled diabetes and hypertension. HPI  Unfortunately, since the last time I saw him, he has not been consistent with nutrition.  He says he has been working from home for about a year and this is really made it challenging for him to be consistent with his nutrition.  His last hemoglobin A1c showed uncontrolled diabetes.  He is on insulin for his diabetes in addition to 4 other medications.  Thankfully, he has not had coronary artery disease or cerebrovascular disease today. His hypertension is reasonably okay with amlodipine although it could be better. He denies any chest pain, dyspnea, palpitations or limb weakness. Past Medical History:  Diagnosis Date  . Diabetes mellitus ([place])    Type 2 x 9 yrs.  . Hyperlipidemia   . Hypertension    x 5 yrs      History reviewed. No pertinent family history.  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 Smoking status: Current Every Day Smoker  . Smokeless tobacco: Never Used  . Tobacco comment: Cigar a day  Substance Use Topics  . Alcohol use: Yes    Comment: 6 pack a week    Current Meds  Medication Sig  . amLODipine (NORVASC) 5 MG tablet Take 1 tablet (5 mg total) by mouth daily.  . BD PEN NEEDLE NANO U/F 32G X 4 MM MISC USE AS DIRECTED TWICE DAILY  . benazepril (LOTENSIN) 40 MG tablet Take 1 tablet (40 mg total) by mouth daily.  . Dulaglutide (TRULICITY) 1.5 0000000 SOPN Inject 1.5  mg into the skin once a week.  [name] Kitchen glipiZIDE (GLUCOTROL) 5 MG tablet TAKE 1 TABLET (5 MG TOTAL) BY MOUTH 2 (TWO) TIMES DAILY BEFORE A MEAL.  [name] Kitchen insulin glargine (LANTUS) 100 UNIT/ML injection Inject 20 Units into the skin daily.  [name] Kitchen JARDIANCE 10 MG TABS tablet TAKE 10 MG BY MOUTH DAILY BEFORE BREAKFAST.  . metFORMIN (GLUCOPHAGE) 1000 MG tablet TAKE 1 TABLET (1,000 MG TOTAL) BY MOUTH 2 (TWO) TIMES DAILY WITH A MEAL.  . [DISCONTINUED] benazepril (LOTENSIN) 40 MG tablet Take 1 tablet (40 mg total) by mouth daily.  . [DISCONTINUED] Dulaglutide (TRULICITY) 1.5 0000000 SOPN Inject 1.5 mg into the skin once a week.       Objective:   Today's Vitals: BP 140/82 (BP Location: Left Arm, Patient Position: Sitting, Cuff Size: Normal)   Pulse 94   Temp 97.6 F (36.4 C) (Temporal)   Resp 18   Ht 5\' 5"  (1.651 m)   Wt 154 lb 3.2 oz (69.9 kg)   SpO2 99%   BMI 25.66 kg/m  Vitals with BMI 10/11/2019 06/21/2019 07/08/2017  Height 5\' 5"  5\' 5"  5\' 5"   Weight 154 lbs 3 oz 151 lbs 13 oz 169 lbs  BMI 25.66 [id number]  Systolic [id number]  Diastolic 82 90 91  Pulse 94 78 103     Physical Exam  He looks systemically well.  His weight is fairly stable, he has gained about 3 pounds since last visit.  Blood pressure slightly elevated today but he is alert and orientated without any focal neurological signs.     Assessment   1. Essential hypertension, benign   2. Uncontrolled type 2 diabetes mellitus with complication, with long-term current use of insulin ([place])   3. Vitamin D deficiency   4. Other hyperlipidemia       Tests ordered Orders Placed This Encounter  Procedures  . COMPLETE METABOLIC PANEL WITH GFR  . Hemoglobin A1c  . VITAMIN D 25 Hydroxy (Vit-D Deficiency, Fractures)     Plan: 1. Blood work is ordered as above. 2. We discussed his diabetes and complications resulting from this and the importance of keeping it under control.  We discussed the importance of reducing insulin levels  and this can be done through intermittent fasting which she has done before.  Also we discussed a plant-based diet.  He is very determined to make significant changes as he will turn 34 tomorrow. 3. Further recommendations will depend on blood results and I will see him in 6 weeks time for follow-up. 4. Today I spent 40 minutes with this patient, the time was spent discussing nutrition, complications of diabetes and also some questions regarding COVID-19 vaccination.   Meds ordered this encounter  Medications  . benazepril (LOTENSIN) 40 MG tablet    Sig: Take 1 tablet (40 mg total) by mouth daily.    Dispense:  90 tablet    Refill:  0  . Dulaglutide (TRULICITY) 1.5 0000000 SOPN    Sig: Inject 1.5 mg into the skin once a week.    Dispense:  4 pen    Refill:  3    Levante Simones Luther Parody, MD

## 2019-10-11 NOTE — Patient Instructions (Signed)
[facility] Dietary Recommendations for Weight Loss What to Avoid . Avoid added sugars o Often added sugar can be found in processed foods such as many condiments, dry cereals, cakes, cookies, chips, crisps, crackers, candies, sweetened drinks, etc.  o Read labels and AVOID/DECREASE use of foods with the following in their ingredient list: Sugar, fructose, high fructose corn syrup, sucrose, glucose, maltose, dextrose, molasses, cane sugar, brown sugar, any type of syrup, agave nectar, etc.   . Avoid snacking in between meals . Avoid foods made with flour o If you are going to eat food made with flour, choose those made with whole-grains; and, minimize your consumption as much as is tolerable . Avoid processed foods o These foods are generally stocked in the middle of the grocery store. Focus on shopping on the perimeter of the grocery.  . Avoid Meat  o We recommend following a plant-based diet at [facility]. Thus, we recommend avoiding meat as a general rule. Consider eating beans, legumes, eggs, and/or dairy products for regular protein sources o If you plan on eating meat limit to 4 ounces of meat at a time and choose lean options such as Fish, chicken, turkey. Avoid red meat intake such as pork and/or steak What to Include . Vegetables o GREEN LEAFY VEGETABLES: Kale, spinach, mustard greens, collard greens, cabbage, broccoli, etc. o OTHER: Asparagus, cauliflower, eggplant, carrots, peas, Brussel sprouts, tomatoes, bell peppers, zucchini, beets, cucumbers, etc. . Grains, seeds, and legumes o Beans: kidney beans, black eyed peas, garbanzo beans, black beans, pinto beans, etc. o Whole, unrefined grains: brown rice, barley, bulgur, oatmeal, etc. . Healthy fats  o Avoid highly processed fats such as vegetable oil o Examples of healthy fats: avocado, olives, virgin olive oil, dark chocolate (?72% Cocoa), nuts (peanuts, almonds, walnuts, cashews, pecans, etc.) . None to Low  Intake of Animal Sources of Protein o Meat sources: chicken, turkey, salmon, tuna. Limit to 4 ounces of meat at one time. o Consider limiting dairy sources, but when choosing dairy focus on: PLAIN Greek yogurt, cottage cheese, high-protein milk . Fruit o Choose berries  When to Eat . Intermittent Fasting: o Choosing not to eat for a specific time period, but DO FOCUS ON HYDRATION when fasting o Multiple Techniques: - Time Restricted Eating: eat 3 meals in a day, each meal lasting no more than 60 minutes, no snacks between meals - 16-18 hour fast: fast for 16 to 18 hours up to 7 days a week. Often suggested to start with 2-3 nonconsecutive days per week.  . Remember the time you sleep is counted as fasting.  . Examples of eating schedule: Fast from 7:00pm-11:00am. Eat between 11:00am-7:00pm.  - 24-hour fast: fast for 24 hours up to every other day. Often suggested to start with 1 day per week . Remember the time you sleep is counted as fasting . Examples of eating schedule:  o Eating day: eat 2-3 meals on your eating day. If doing 2 meals, each meal should last no more than 90 minutes. If doing 3 meals, each meal should last no more than 60 minutes. Finish last meal by 7:00pm. o Fasting day: Fast until 7:00pm.  o IF YOU FEEL UNWELL FOR ANY REASON/IN ANY WAY WHEN FASTING, STOP FASTING BY EATING A NUTRITIOUS SNACK OR LIGHT MEAL o ALWAYS FOCUS ON HYDRATION DURING FASTS - Acceptable Hydration sources: water, broths, tea/coffee (black tea/coffee is best but using a small amount of whole-fat dairy products in coffee/tea is acceptable).  -   Poor Hydration Sources: anything with sugar or artificial sweeteners added to it  These recommendations have been developed for patients that are actively receiving medical care from either Dr. Katlynne Mckercher or Sarah Gray, DNP, NP-C at [facility]. These recommendations are developed for patients with specific medical conditions and are not meant to be  distributed or used by others that are not actively receiving care from either provider listed above at [facility]. It is not appropriate to participate in the above eating plans without proper medical supervision.   Reference: Fung, J. The obesity code. [place]: [place]; 2016.   

## 2019-10-12 ENCOUNTER — Other Ambulatory Visit (INDEPENDENT_AMBULATORY_CARE_PROVIDER_SITE_OTHER): Payer: Self-pay | Admitting: Internal Medicine

## 2019-10-12 LAB — COMPLETE METABOLIC PANEL WITH GFR
AG Ratio: 1.5 (calc) (ref 1.0–2.5)
ALT: 18 U/L (ref 9–46)
AST: 21 U/L (ref 10–40)
Albumin: 4.6 g/dL (ref 3.6–5.1)
Alkaline phosphatase (APISO): 72 U/L (ref 36–130)
BUN: 11 mg/dL (ref 7–25)
CO2: 27 mmol/L (ref 20–32)
Calcium: 10 mg/dL (ref 8.6–10.3)
Chloride: 106 mmol/L (ref 98–110)
Creat: 0.79 mg/dL (ref 0.60–1.35)
GFR, Est African American: 123 mL/min/{1.73_m2} (ref >=60)
GFR, Est Non African American: 106 mL/min/{1.73_m2} (ref >=60)
Globulin: 3.1 g/dL (calc) (ref 1.9–3.7)
Glucose, Bld: 135 mg/dL — ABNORMAL HIGH (ref 65–99)
Potassium: 4.4 mmol/L (ref 3.5–5.3)
Sodium: 142 mmol/L (ref 135–146)
Total Bilirubin: 0.4 mg/dL (ref 0.2–1.2)
Total Protein: 7.7 g/dL (ref 6.1–8.1)

## 2019-10-12 LAB — HEMOGLOBIN A1C
Hgb A1c MFr Bld: 8.7 % of total Hgb — ABNORMAL HIGH (ref <5.7)
Mean Plasma Glucose: 203 (calc)
eAG (mmol/L): 11.2 (calc)

## 2019-10-12 LAB — VITAMIN D 25 HYDROXY (VIT D DEFICIENCY, FRACTURES): Vit D, 25-Hydroxy: 29 ng/mL — ABNORMAL LOW (ref 30–100)

## 2019-10-12 MED ORDER — TRULICITY 3 MG/0.5ML ~~LOC~~ SOAJ: 3.0000 mg | SUBCUTANEOUS | 3 refills | Status: DC

## 2019-10-12 NOTE — Progress Notes (Signed)
Patient called.  Given lab results via voicemail.Given instructions on the Trulicity 3 mg 1 inject once week. Then to decrease the Glipizide  & insulin to 15 units . To also concentrate on the intermitted fast & nutrition.

## 2019-10-23 ENCOUNTER — Other Ambulatory Visit (INDEPENDENT_AMBULATORY_CARE_PROVIDER_SITE_OTHER): Payer: Self-pay | Admitting: Internal Medicine

## 2019-11-23 ENCOUNTER — Encounter (INDEPENDENT_AMBULATORY_CARE_PROVIDER_SITE_OTHER): Payer: Self-pay | Admitting: Internal Medicine

## 2019-11-23 ENCOUNTER — Other Ambulatory Visit: Payer: Self-pay

## 2019-11-23 ENCOUNTER — Ambulatory Visit (INDEPENDENT_AMBULATORY_CARE_PROVIDER_SITE_OTHER): Payer: 59 | Admitting: Internal Medicine

## 2019-11-23 VITALS — BP 125/95 | HR 93 | Temp 97.5°F | Ht 65.0 in | Wt 151.6 lb

## 2019-11-23 DIAGNOSIS — R5383 Other fatigue: Secondary | ICD-10-CM

## 2019-11-23 DIAGNOSIS — R5381 Other malaise: Secondary | ICD-10-CM

## 2019-11-23 DIAGNOSIS — Z794 Long term (current) use of insulin: Secondary | ICD-10-CM

## 2019-11-23 DIAGNOSIS — E7849 Other hyperlipidemia: Secondary | ICD-10-CM | POA: Diagnosis not present

## 2019-11-23 DIAGNOSIS — Z125 Encounter for screening for malignant neoplasm of prostate: Secondary | ICD-10-CM

## 2019-11-23 DIAGNOSIS — R6882 Decreased libido: Secondary | ICD-10-CM | POA: Diagnosis not present

## 2019-11-23 DIAGNOSIS — IMO0002 Reserved for concepts with insufficient information to code with codable children: Secondary | ICD-10-CM

## 2019-11-23 DIAGNOSIS — E1165 Type 2 diabetes mellitus with hyperglycemia: Secondary | ICD-10-CM

## 2019-11-23 DIAGNOSIS — E118 Type 2 diabetes mellitus with unspecified complications: Secondary | ICD-10-CM

## 2019-11-23 DIAGNOSIS — I10 Essential (primary) hypertension: Secondary | ICD-10-CM | POA: Diagnosis not present

## 2019-11-23 MED ORDER — JARDIANCE 25 MG PO TABS: 25.0000 mg | ORAL_TABLET | Freq: Every day | ORAL | 3 refills | Status: DC

## 2019-11-23 NOTE — Progress Notes (Signed)
Metrics: Intervention Frequency ACO  Documented Smoking Status Yearly  Screened one or more times in 24 months  Cessation Counseling or  Active cessation medication Past 24 months  Past 24 months   Guideline developer: UpToDate (See UpToDate for funding source) Date Released: 2014       Wellness Office Visit  Subjective:  Patient ID: Justin [name], male    DOB: [birth year]  Age: 49 y.o. MRN: [id number]  CC: This man comes in for uncontrolled diabetes. HPI  Unfortunately, his diet has not improved.  He has had lack of sleep because of his work and this is caused him to not eat as efficiently as he should do.  Also, he cannot remember if he did increase to Trulicity dose that I prescribed last time.  He says that for the last few days, his blood sugar levels have been elevated to above 300. We also discussed a plant-based diet on the last visit and he has failed to initiate this. On closer questioning, he describes extreme fatigue.  He also describes decreased libido. Past Medical History:  Diagnosis Date  . Diabetes mellitus ([place])    Type 2 x 9 yrs.  . Hyperlipidemia   . Hypertension    x 5 yrs      History reviewed. No pertinent family history.  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 Smoking status: Current Every Day Smoker  . Smokeless tobacco: Never Used  . Tobacco comment: Cigar a day  Substance Use Topics  . Alcohol use: Yes    Comment: 6 pack a week    Current Meds  Medication Sig  . amLODipine (NORVASC) 5 MG tablet Take 1 tablet (5 mg total) by mouth daily.  . BD PEN NEEDLE NANO U/F 32G X 4 MM MISC USE AS DIRECTED TWICE DAILY  . benazepril (LOTENSIN) 40 MG tablet Take 1 tablet (40 mg total) by mouth daily.  . Dulaglutide (TRULICITY) 3 0000000 SOPN Inject 3 mg into the skin once a week.  . insulin glargine (LANTUS) 100 UNIT/ML injection Inject 20 Units into  the skin daily.  [name] Kitchen JARDIANCE 10 MG TABS tablet TAKE 10 MG BY MOUTH DAILY BEFORE BREAKFAST.  . metFORMIN (GLUCOPHAGE) 1000 MG tablet TAKE 1 TABLET (1,000 MG TOTAL) BY MOUTH 2 (TWO) TIMES DAILY WITH A MEAL.      Objective:   Today's Vitals: BP (!) 125/95 (BP Location: Left Arm, Patient Position: Sitting, Cuff Size: Normal)   Pulse 93   Temp (!) 97.5 F (36.4 C) (Temporal)   Ht 5\' 5"  (1.651 m)   Wt 151 lb 9.6 oz (68.8 kg)   SpO2 98%   BMI 25.23 kg/m  Vitals with BMI 11/23/2019 10/11/2019 06/21/2019  Height 5\' 5"  5\' 5"  5\' 5"   Weight 151 lbs 10 oz 154 lbs 3 oz 151 lbs 13 oz  BMI 25.23 [id number]  Systolic 0000000 [id number]  Diastolic 95 82 90  Pulse 93 94 78     Physical Exam  No new physical findings except he has lost 3 pounds since the last visit.  Blood pressure is reasonable although diastolic blood pressure is elevated.     Assessment   1. Uncontrolled type 2 diabetes mellitus with complication, with long-term current use of insulin ([place])   2. Essential hypertension, benign   3. Other hyperlipidemia   4. Decreased libido  5. Malaise and fatigue   6. Special screening for malignant neoplasm of prostate       Tests ordered Orders Placed This Encounter  Procedures  . T3, free  . T4  . TSH  . Testosterone Total,Free,Bio, Males  . PSA     Plan: 1.  Blood work is ordered above to check for thyroid, testosterone we will also check PSA because his father had a history of prostate cancer early. 2.  I am going to increase the Jardiance to 25 mg daily.  He has discontinued glipizide and reduce the insulin dose as requested. 3.  I stressed to him the importance of his diet.  He will try to do better. 4.  I will see him soon in about 4 weeks time for close follow-up and we will discuss blood work that has been done today.     Meds ordered this encounter  Medications  . empagliflozin (JARDIANCE) 25 MG TABS tablet    Sig: Take 25 mg by mouth daily before breakfast.     Dispense:  30 tablet    Refill:  3    New dose    Eiley Mcginnity Luther Parody, MD

## 2019-11-24 LAB — PSA: PSA: 0.6 ng/mL (ref <=4.0)

## 2019-11-24 LAB — TESTOSTERONE TOTAL,FREE,BIO, MALES
Albumin: 4.8 g/dL (ref 3.6–5.1)
Sex Hormone Binding: 21 nmol/L (ref 10–50)
Testosterone, Bioavailable: 172.2 ng/dL (ref >=110.0)
Testosterone, Free: 78.7 pg/mL (ref 46.0–224.0)
Testosterone: 432 ng/dL (ref 250–827)

## 2019-11-24 LAB — TSH: TSH: 1.53 mIU/L (ref 0.40–4.50)

## 2019-11-24 LAB — T3, FREE: T3, Free: 3.5 pg/mL (ref 2.3–4.2)

## 2019-11-24 LAB — T4: T4, Total: 8.6 ug/dL (ref 4.9–10.5)

## 2019-12-17 ENCOUNTER — Ambulatory Visit: Payer: 59 | Attending: Internal Medicine

## 2019-12-17 DIAGNOSIS — Z23 Encounter for immunization: Secondary | ICD-10-CM

## 2019-12-17 NOTE — Progress Notes (Signed)
   Covid-19 Vaccination Clinic  Name:  Justin [name]    MRN: [id number] DOB: 1970/11/07  12/17/2019  [name] was observed post Covid-19 immunization for 15 minutes without incident. He was provided with Vaccine Information Sheet and instruction to access the V-Safe system.   [name] was instructed to call 911 with any severe reactions post vaccine: [name] Kitchen Difficulty breathing  . Swelling of face and throat  . A fast heartbeat  . A bad rash all over body  . Dizziness and weakness   Immunizations Administered    Name Date Dose VIS Date Route   Pfizer COVID-19 Vaccine 12/17/2019  9:58 AM 0.3 mL 09/22/2018 Intramuscular   Manufacturer: [employer], [employer]   Lot: [id number]   [place]: [id number]

## 2020-01-03 ENCOUNTER — Ambulatory Visit (INDEPENDENT_AMBULATORY_CARE_PROVIDER_SITE_OTHER): Payer: 59 | Admitting: Internal Medicine

## 2020-01-07 ENCOUNTER — Ambulatory Visit: Payer: 59 | Attending: Internal Medicine

## 2020-01-07 DIAGNOSIS — Z23 Encounter for immunization: Secondary | ICD-10-CM

## 2020-01-07 NOTE — Progress Notes (Signed)
   Covid-19 Vaccination Clinic  Name:  Justin [name]    MRN: [id number] DOB: 06/04/1971  01/07/2020  Justin [name] was observed post Covid-19 immunization for 15 minutes without incident. He was provided with Vaccine Information Sheet and instruction to access the V-Safe system.   Justin [name] was instructed to call 911 with any severe reactions post vaccine: [name] Kitchen Difficulty breathing  . Swelling of face and throat  . A fast heartbeat  . A bad rash all over body  . Dizziness and weakness   Immunizations Administered    Name Date Dose VIS Date Route   Pfizer COVID-19 Vaccine 01/07/2020  9:09 AM 0.3 mL 09/22/2018 Intramuscular   Manufacturer: [employer], [employer]   Lot: [id number]   [place]: [id number]

## 2020-01-09 ENCOUNTER — Other Ambulatory Visit (INDEPENDENT_AMBULATORY_CARE_PROVIDER_SITE_OTHER): Payer: Self-pay | Admitting: Internal Medicine

## 2020-01-18 ENCOUNTER — Other Ambulatory Visit: Payer: Self-pay

## 2020-01-18 ENCOUNTER — Ambulatory Visit (INDEPENDENT_AMBULATORY_CARE_PROVIDER_SITE_OTHER): Payer: 59 | Admitting: Nurse Practitioner

## 2020-01-18 ENCOUNTER — Encounter: Payer: Self-pay | Admitting: Nurse Practitioner

## 2020-01-18 VITALS — BP 135/79 | HR 102 | Temp 97.7°F | Ht 65.0 in | Wt 147.2 lb

## 2020-01-18 DIAGNOSIS — Z794 Long term (current) use of insulin: Secondary | ICD-10-CM

## 2020-01-18 DIAGNOSIS — E1165 Type 2 diabetes mellitus with hyperglycemia: Secondary | ICD-10-CM

## 2020-01-18 DIAGNOSIS — I10 Essential (primary) hypertension: Secondary | ICD-10-CM | POA: Diagnosis not present

## 2020-01-18 DIAGNOSIS — R55 Syncope and collapse: Secondary | ICD-10-CM

## 2020-01-18 DIAGNOSIS — E118 Type 2 diabetes mellitus with unspecified complications: Secondary | ICD-10-CM

## 2020-01-18 DIAGNOSIS — IMO0002 Reserved for concepts with insufficient information to code with codable children: Secondary | ICD-10-CM

## 2020-01-18 MED ORDER — BD PEN NEEDLE NANO U/F 32G X 4 MM MISC: 3 refills | Status: DC

## 2020-01-18 MED ORDER — AMLODIPINE BESYLATE 5 MG PO TABS: 5.0000 mg | ORAL_TABLET | Freq: Every day | ORAL | 1 refills | Status: DC

## 2020-01-18 NOTE — Progress Notes (Addendum)
Subjective:  Patient ID: Justin [name], male    DOB: 13-Nov-1970  Age: 49 y.o. MRN: [id number]  CC:  Chief Complaint  Patient presents with   Loss of Consciousness      HPI  This patient arrives today for acute visit for the above.  He has a past medical history positive for hypertension, type 2 diabetes, hyperlipidemia, and type 2 diabetes.   He tells me he has had a syncopal episode twice in the last month.  The first episode occurred on May 25 at which point he did follow-up in the emergency department.  He tells me prior to the syncopal episode he had been working in the yard for 3 to 4 hours.  He tells me the day was hot and humid.  He did drink some beer later on that afternoon with his neighbor.  He went to get up to walk across the yard and next thing he knew the neighbor was standing over him checking on him.  He tells me no seizure-like activity was reported to him by the neighbor.  He did not experience any confusion upon waking up.  He tells me he was unconscious for less than 2 minutes.  He tells me right before the episode he did feel uneasy, anxious, and hot.  He did have evaluation done in the emergency department and they determined that the episode was related to him overheating.  He did mention that his blood pressure was 107/70 when he was in the emergency department.  He believes that his blood sugar was checked after this episode and it was around 180.  The second episode occurred on 01/16/2020.  He tells me prior to the episode he had been playing golf outside for approximately 4 hours.  He then went to a barbecue at his neighbors and drink 2-3 beers.  He was sitting in a chair, and went to get up from the chair but felt unwell and asked his wife to get him some cold ice water.  I was at that moment that he passed out, he tells me again he was only unconscious for a short amount of time.  At that time he did vomit and did lose control of his bladder.  He did feel hot and  anxious prior to the syncopal episode.  He reports that upon waking up initially, his wife told him he did have slurred speech.  This did eventually resolve.  A few days prior to both episodes he did have a COVID-19 vaccine administered.  He is wondering if this vaccine could be playing a role.     Past Medical History:  Diagnosis Date   Diabetes mellitus ([place])    Type 2 x 9 yrs.   Hyperlipidemia    Hypertension    x 5 yrs      No family history on file.  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   benazepril (LOTENSIN) 40 MG tablet Take 1 tablet (40 mg total)  by mouth daily.   Dulaglutide (TRULICITY) 3 BB/0.4UG SOPN Inject 3 mg into the skin once a week.   empagliflozin (JARDIANCE) 25 MG TABS tablet Take 25 mg by mouth daily before breakfast.   insulin glargine (LANTUS) 100 UNIT/ML injection Inject 20 Units into the skin daily.   Insulin Pen Needle (BD PEN NEEDLE NANO U/F) 32G X 4 MM MISC Use to check blood sugar daily   [DISCONTINUED] amLODipine (NORVASC) 5 MG tablet Take 1 tablet (5 mg total) by mouth daily.   [DISCONTINUED] BD PEN NEEDLE NANO U/F 32G X 4 MM MISC USE AS DIRECTED TWICE DAILY   [DISCONTINUED] metFORMIN (GLUCOPHAGE) 1000 MG tablet TAKE 1 TABLET (1,000 MG TOTAL) BY MOUTH 2 (TWO) TIMES DAILY WITH A MEAL.    ROS:  See HPI   Objective:   Today's Vitals: BP 135/79 (BP Location: Left Arm, Cuff Size: Normal)    Pulse (!) 102    Temp 97.7 F (36.5 C) (Temporal)    Ht _0  (1.651 m)    Wt 147 lb 3.2 oz (66.8 kg)    SpO2 97%    BMI 24.50 kg/m  Vitals with BMI  01/18/2020 11/23/2019 10/11/2019  Height _1  _2  _3   Weight 147 lbs 3 oz 151 lbs 10 oz 154 lbs 3 oz  BMI 24.5 89.16 94.50  Systolic 388 828 003  Diastolic 79 95 82  Pulse 491 93 94     Physical Exam Vitals reviewed.  Constitutional:      Appearance: Normal appearance.  HENT:     Head: Normocephalic and atraumatic.  Neck:     Vascular: No carotid bruit.  Cardiovascular:     Rate and Rhythm: Normal rate and regular rhythm.  Pulmonary:     Effort: Pulmonary effort is normal.     Breath sounds: Normal breath sounds.  Musculoskeletal:     Cervical back: Neck supple.  Skin:    General: Skin is warm and dry.  Neurological:     Mental Status: He is alert and oriented to person, place, and time.  Psychiatric:        Mood and Affect: Mood normal.        Behavior: Behavior normal.        Thought Content: Thought content normal.        Judgment: Judgment normal.       EKG: Sinus rhythm   Assessment and Plan   1. Syncope, unspecified syncope type   2. Essential hypertension, benign   3. Uncontrolled type 2 diabetes mellitus with complication, with long-term current use of insulin ([place])      Plan: 1.  Orthostatic vital signs negative, EKG with no significant abnormalities noted.  Will refer to cardiology for possible ambulatory cardiac monitoring, will send patient for cardiac echocardiogram will also get MRI of the brain for further evaluation.  We will collect blood work today to rule out anemia, metabolic abnormalities, and thyroid dysfunction.  While awaiting completion of work-up I recommend the patient focus on hydration, avoid being out in the sun for prolonged periods of time, and change positions slowly.  He tells me he understands.  I did discuss the above plan with Dr. Anastasio Champion and he is also agreeable.  2.  We will refill his amlodipine per his request.  3.  We will check A1c.   Tests ordered Orders Placed This Encounter  Procedures   CBC   CMP with  eGFR([facility])   Hemoglobin A1c   TSH  Ambulatory referral to Cardiology   ECHOCARDIOGRAM COMPLETE      Meds ordered this encounter  Medications   amLODipine (NORVASC) 5 MG tablet    Sig: Take 1 tablet (5 mg total) by mouth daily.    Dispense:  90 tablet    Refill:  1    Order Specific Question:   Supervising Provider    Answer:   Hurshel Party C [[id number]]   Insulin Pen Needle (BD PEN NEEDLE NANO U/F) 32G X 4 MM MISC    Sig: Use to check blood sugar daily    Dispense:  300 each    Refill:  3    Order Specific Question:   Supervising Provider    Answer:   Doree Albee [[id number]]    Patient to follow-up in 1 month or sooner as needed  Ailene Ards, NP

## 2020-01-19 LAB — COMPLETE METABOLIC PANEL WITH GFR
AG Ratio: 1.7 (calc) (ref 1.0–2.5)
ALT: 20 U/L (ref 9–46)
AST: 13 U/L (ref 10–40)
Albumin: 4.7 g/dL (ref 3.6–5.1)
Alkaline phosphatase (APISO): 83 U/L (ref 36–130)
BUN: 15 mg/dL (ref 7–25)
CO2: 23 mmol/L (ref 20–32)
Calcium: 10 mg/dL (ref 8.6–10.3)
Chloride: 104 mmol/L (ref 98–110)
Creat: 0.87 mg/dL (ref 0.60–1.35)
GFR, Est African American: 117 mL/min/{1.73_m2} (ref >=60)
GFR, Est Non African American: 101 mL/min/{1.73_m2} (ref >=60)
Globulin: 2.8 g/dL (calc) (ref 1.9–3.7)
Glucose, Bld: 191 mg/dL — ABNORMAL HIGH (ref 65–99)
Potassium: 4.1 mmol/L (ref 3.5–5.3)
Sodium: 140 mmol/L (ref 135–146)
Total Bilirubin: 0.3 mg/dL (ref 0.2–1.2)
Total Protein: 7.5 g/dL (ref 6.1–8.1)

## 2020-01-19 LAB — CBC
HCT: 46.8 % (ref 38.5–50.0)
Hemoglobin: 15.7 g/dL (ref 13.2–17.1)
MCH: 29.8 pg (ref 27.0–33.0)
MCHC: 33.5 g/dL (ref 32.0–36.0)
MCV: 88.8 fL (ref 80.0–100.0)
MPV: 10 fL (ref 7.5–12.5)
Platelets: 350 10*3/uL (ref 140–400)
RBC: 5.27 10*6/uL (ref 4.20–5.80)
RDW: 13.1 % (ref 11.0–15.0)
WBC: 6 10*3/uL (ref 3.8–10.8)

## 2020-01-19 LAB — TSH: TSH: 0.81 mIU/L (ref 0.40–4.50)

## 2020-01-19 LAB — HEMOGLOBIN A1C
Hgb A1c MFr Bld: 9.3 % of total Hgb — ABNORMAL HIGH (ref <5.7)
Mean Plasma Glucose: 220 (calc)
eAG (mmol/L): 12.2 (calc)

## 2020-02-03 ENCOUNTER — Other Ambulatory Visit: Payer: Self-pay

## 2020-02-03 ENCOUNTER — Ambulatory Visit (HOSPITAL_COMMUNITY)
Admission: RE | Admit: 2020-02-03 | Discharge: 2020-02-03 | Disposition: A | Discharge status: Home / Self Care | Payer: 59 | Source: Ambulatory Visit | Attending: Nurse Practitioner | Admitting: Nurse Practitioner

## 2020-02-03 DIAGNOSIS — R55 Syncope and collapse: Secondary | ICD-10-CM

## 2020-02-03 NOTE — Progress Notes (Signed)
*  PRELIMINARY RESULTS* Echocardiogram 2D Echocardiogram has been performed.  Justin [name] 02/03/2020, 9:20 AM

## 2020-02-11 ENCOUNTER — Ambulatory Visit: Payer: 59 | Admitting: Cardiology

## 2020-02-15 ENCOUNTER — Other Ambulatory Visit (INDEPENDENT_AMBULATORY_CARE_PROVIDER_SITE_OTHER): Payer: Self-pay | Admitting: Internal Medicine

## 2020-02-16 ENCOUNTER — Other Ambulatory Visit: Payer: Self-pay

## 2020-02-16 ENCOUNTER — Ambulatory Visit (HOSPITAL_COMMUNITY)
Admission: RE | Admit: 2020-02-16 | Discharge: 2020-02-16 | Disposition: A | Discharge status: Home / Self Care | Payer: 59 | Source: Ambulatory Visit | Attending: Nurse Practitioner | Admitting: Nurse Practitioner

## 2020-02-16 DIAGNOSIS — R55 Syncope and collapse: Secondary | ICD-10-CM | POA: Insufficient documentation

## 2020-02-16 MED ORDER — GADOBUTROL 1 MMOL/ML IV SOLN
7.0000 mL | Freq: Once | INTRAVENOUS | Status: AC | PRN
  Administered 2020-02-16: 7 mL via INTRAVENOUS

## 2020-02-17 ENCOUNTER — Ambulatory Visit (INDEPENDENT_AMBULATORY_CARE_PROVIDER_SITE_OTHER): Payer: 59 | Admitting: Nurse Practitioner

## 2020-02-17 ENCOUNTER — Ambulatory Visit (INDEPENDENT_AMBULATORY_CARE_PROVIDER_SITE_OTHER): Payer: 59 | Admitting: Internal Medicine

## 2020-02-17 ENCOUNTER — Other Ambulatory Visit: Payer: Self-pay

## 2020-02-17 ENCOUNTER — Encounter (INDEPENDENT_AMBULATORY_CARE_PROVIDER_SITE_OTHER): Payer: Self-pay | Admitting: Nurse Practitioner

## 2020-02-17 VITALS — BP 118/74 | HR 98 | Resp 16 | Ht 65.0 in | Wt 144.6 lb

## 2020-02-17 DIAGNOSIS — Z800 Family history of malignant neoplasm of digestive organs: Secondary | ICD-10-CM | POA: Diagnosis not present

## 2020-02-17 DIAGNOSIS — R634 Abnormal weight loss: Secondary | ICD-10-CM

## 2020-02-17 DIAGNOSIS — R55 Syncope and collapse: Secondary | ICD-10-CM | POA: Diagnosis not present

## 2020-02-17 DIAGNOSIS — Z8042 Family history of malignant neoplasm of prostate: Secondary | ICD-10-CM

## 2020-02-17 NOTE — Progress Notes (Signed)
Subjective:  Patient ID: Justin [name], male    DOB: 1971-04-05  Age: 49 y.o. MRN: [id number]  CC:  Chief Complaint  Patient presents with  . Loss of Consciousness    hasn't had anymore episodes      HPI  This patient follows up today for history of syncope.  At his last office visit which was approximately 1 month ago he had disclosed that he had 2 episodes of syncope.  So far he has undergone EKG, cardiac echocardiogram, and MRI of the brain.  All of which have been normal.  Over the last month he has not had any additional episodes of syncope, however today he tells me on the golf course he started to feel a little weak and lightheaded.  He never did actually pass out though.  He has been trying to focus on hydration since his last office visit.  He also mentions to me that he has noticed some unintentional weight loss over the last 3 months or so.  He is currently on Trulicity and Jardiance and wonders if these could be related to the weight loss.  He does have a significant family history of both colon and prostate cancer.  He denies any changes to his urinary symptoms, stools, abdominal pain, blood in his stool.  Per chart review I do see that he had a colonoscopy back in 2013 and recommendation was for him to repeat colonoscopy in 5 years.  Thus, he is overdue for colon cancer screening.  He has been referred to cardiology for further evaluation of his syncopal episodes, it appears he is scheduled next week.   Past Medical History:  Diagnosis Date  . Diabetes mellitus ([place])    Type 2 x 9 yrs.  . Hyperlipidemia   . Hypertension    x 5 yrs      No family history on file.  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 Smoking status: Current Every Day Smoker  . Smokeless tobacco: Never Used  . Tobacco comment: Cigar a day  Substance Use Topics  . Alcohol use: Yes     Comment: 6 pack a week     Current Meds  Medication Sig  . amLODipine (NORVASC) 5 MG tablet Take 1 tablet (5 mg total) by mouth daily.  . benazepril (LOTENSIN) 40 MG tablet Take 1 tablet (40 mg total) by mouth daily.  . Dulaglutide (TRULICITY) 3 AJ/6.8TL SOPN Inject 3 mg into the skin once a week.  . empagliflozin (JARDIANCE) 25 MG TABS tablet Take 25 mg by mouth daily before breakfast.  . insulin glargine (LANTUS) 100 UNIT/ML injection Inject 20 Units into the skin daily.  . Insulin Pen Needle (BD PEN NEEDLE NANO U/F) 32G X 4 MM MISC Use to check blood sugar daily    ROS:  Review of Systems  Constitutional: Positive for malaise/fatigue and weight loss. Negative for fever.  Respiratory: Negative.   Cardiovascular: Negative.   Gastrointestinal: Negative.   Neurological: Positive for dizziness. Negative for loss of consciousness.     Objective:   Today's Vitals: BP 118/74   Pulse 98   Resp 16   Ht _0  (1.651 m)   Wt 144 lb 9.6 oz (65.6 kg)   SpO2 98%   BMI 24.06 kg/m  Vitals with BMI 02/17/2020 01/18/2020 11/23/2019  Height _1   _0  _1   Weight 144 lbs 10 oz 147 lbs 3 oz 151 lbs 10 oz  BMI 24.06 95.3 69.22  Systolic 300 979 499  Diastolic 74 79 95  Pulse 98 102 93     Physical Exam Vitals reviewed.  Constitutional:      Appearance: Normal appearance.  HENT:     Head: Normocephalic and atraumatic.  Cardiovascular:     Rate and Rhythm: Normal rate and regular rhythm.  Pulmonary:     Effort: Pulmonary effort is normal.     Breath sounds: Normal breath sounds.  Musculoskeletal:     Cervical back: Neck supple.  Skin:    General: Skin is warm and dry.  Neurological:     Mental Status: He is alert and oriented to person, place, and time.  Psychiatric:        Mood and Affect: Mood normal.        Behavior: Behavior normal.        Thought Content: Thought content normal.        Judgment: Judgment normal.          Assessment and Plan   1. Weight loss,  unintentional   2. Family history of colon cancer   3. Family history of prostate cancer   4. Syncope, unspecified syncope type      Plan: 1.-4.  I will collect blood work including screening for prostate cancer, metabolic panel, sed rate, C-reactive protein for initial evaluation.  I am also going to refer him back to gastroenterology for colon cancer screening as he is overdue for this.  I did reassure him that so far all of his work-up for syncope has been negative, but we will perform some additional test considering he has been losing weight unintentionally.  I do think that his Trulicity and Vania Rea may be contributing to his weight loss, however I do want to assume this so further work-up will be completed.  He was encouraged to follow-up with cardiology as scheduled.  If additional evaluation by cardiology does not reveal obvious etiology of his syncope we could consider referral to neurology as he did have some loss of control of his bladder during one episode.  I still feel that seizures are unlikely, however I did discuss this with the patient and we will consider referral once he is seen by the cardiologist.   Tests ordered Orders Placed This Encounter  Procedures  . PSA  . CMP with eGFR([facility])  . Sedimentation Rate  . C-reactive Protein  . Ambulatory referral to Gastroenterology      No orders of the defined types were placed in this encounter.   Patient to follow-up in 6 weeks or sooner as needed.  Ailene Ards, NP

## 2020-02-18 ENCOUNTER — Other Ambulatory Visit (INDEPENDENT_AMBULATORY_CARE_PROVIDER_SITE_OTHER): Payer: Self-pay | Admitting: Nurse Practitioner

## 2020-02-18 LAB — PSA: PSA: 0.5 ng/mL (ref <=4.0)

## 2020-02-18 LAB — COMPLETE METABOLIC PANEL WITH GFR
AG Ratio: 1.6 (calc) (ref 1.0–2.5)
ALT: 21 U/L (ref 9–46)
AST: 16 U/L (ref 10–40)
Albumin: 4.8 g/dL (ref 3.6–5.1)
Alkaline phosphatase (APISO): 76 U/L (ref 36–130)
BUN: 12 mg/dL (ref 7–25)
CO2: 28 mmol/L (ref 20–32)
Calcium: 10.4 mg/dL — ABNORMAL HIGH (ref 8.6–10.3)
Chloride: 102 mmol/L (ref 98–110)
Creat: 0.93 mg/dL (ref 0.60–1.35)
GFR, Est African American: 111 mL/min/{1.73_m2} (ref >=60)
GFR, Est Non African American: 96 mL/min/{1.73_m2} (ref >=60)
Globulin: 3 g/dL (calc) (ref 1.9–3.7)
Glucose, Bld: 137 mg/dL — ABNORMAL HIGH (ref 65–99)
Potassium: 4.7 mmol/L (ref 3.5–5.3)
Sodium: 139 mmol/L (ref 135–146)
Total Bilirubin: 0.4 mg/dL (ref 0.2–1.2)
Total Protein: 7.8 g/dL (ref 6.1–8.1)

## 2020-02-18 LAB — SEDIMENTATION RATE: Sed Rate: 2 mm/h (ref 0–15)

## 2020-02-18 LAB — C-REACTIVE PROTEIN: CRP: 5.9 mg/L (ref <8.0)

## 2020-02-18 NOTE — Progress Notes (Signed)
Orders for further investigation of elevated calcium on last CMP.

## 2020-02-21 ENCOUNTER — Other Ambulatory Visit (INDEPENDENT_AMBULATORY_CARE_PROVIDER_SITE_OTHER): Payer: Self-pay

## 2020-02-21 ENCOUNTER — Other Ambulatory Visit (INDEPENDENT_AMBULATORY_CARE_PROVIDER_SITE_OTHER): Payer: Self-pay | Admitting: Nurse Practitioner

## 2020-02-21 NOTE — Progress Notes (Signed)
PTH and calcium orders for further evaluation of hypercalcemia.

## 2020-02-25 ENCOUNTER — Encounter: Payer: Self-pay | Admitting: Cardiology

## 2020-02-25 ENCOUNTER — Ambulatory Visit: Payer: 59 | Admitting: Cardiology

## 2020-02-25 ENCOUNTER — Telehealth: Payer: Self-pay | Admitting: Cardiology

## 2020-02-25 VITALS — BP 127/87 | HR 96 | Ht 65.0 in | Wt 145.4 lb

## 2020-02-25 DIAGNOSIS — I10 Essential (primary) hypertension: Secondary | ICD-10-CM | POA: Diagnosis not present

## 2020-02-25 NOTE — Progress Notes (Signed)
Clinical Summary Mr. Obrecht is a 49 y.o.male seen today as a new consult for the following medical problems, referred by Dr Anastasio Champion  1. Syncope - 2 total episodes  11/2019 seen in [facility] ER with syncope - reportedly had been outside in high temperatures for several hours. - EKG at that time NSR rate 92 - had covid vaccine x 1   - was out working in the yard for 2-3 hours. Started feeling lightheaded, dizzy. No palpitations - had not ate much, did drink water that day - fell to the floor, seen by neihbor. Out very short period of time  - 2nd episode happened few weeks later - was having a cookout, was sitting at the table and felt very hot. Stood up.  - had been out in the heat with the grill   - not much water daily. Diet cokes 12 oz x 4-5, gatorarde 0 smaller bottles. Liquor whiskey 3 days a week neat x 5 shots     Past Medical History:  Diagnosis Date  . Diabetes mellitus ([place])    Type 2 x 9 yrs.  . Hyperlipidemia   . Hypertension    x 5 yrs     Allergies  Allergen Reactions  . Codeine     Hives, itching      Current Outpatient Medications  Medication Sig Dispense Refill  . amLODipine (NORVASC) 5 MG tablet Take 1 tablet (5 mg total) by mouth daily. 90 tablet 1  . benazepril (LOTENSIN) 40 MG tablet Take 1 tablet (40 mg total) by mouth daily. 90 tablet 0  . Dulaglutide (TRULICITY) 3 FU/9.3AT SOPN Inject 3 mg into the skin once a week. 4 pen 3  . empagliflozin (JARDIANCE) 25 MG TABS tablet Take 25 mg by mouth daily before breakfast. 30 tablet 3  . insulin glargine (LANTUS) 100 UNIT/ML injection Inject 20 Units into the skin daily.    . Insulin Pen Needle (BD PEN NEEDLE NANO U/F) 32G X 4 MM MISC Use to check blood sugar daily 300 each 3   No current facility-administered medications for this visit.     Past Surgical History:  Procedure Laterality Date  .  rt knee arthroscopy    . COLONOSCOPY  04/10/2012   Procedure: COLONOSCOPY;  Surgeon: Rogene Houston, MD;  Location: [facility];  Service: Endoscopy;  Laterality: N/A;  1200     Allergies  Allergen Reactions  . Codeine     Hives, itching       No family history on file.   Social History Mr. Bonaventure reports that he has been smoking. He has never used smokeless tobacco. Mr. Lovelady reports current alcohol use.   Review of Systems CONSTITUTIONAL: No weight loss, fever, chills, weakness or fatigue.  HEENT: Eyes: No visual loss, blurred vision, double vision or yellow sclerae.No hearing loss, sneezing, congestion, runny nose or sore throat.  SKIN: No rash or itching.  CARDIOVASCULAR: per hpi RESPIRATORY: No shortness of breath, cough or sputum.  GASTROINTESTINAL: No anorexia, nausea, vomiting or diarrhea. No abdominal pain or blood.  GENITOURINARY: No burning on urination, no polyuria NEUROLOGICAL:per hpi  MUSCULOSKELETAL: No muscle, back pain, joint pain or stiffness.  LYMPHATICS: No enlarged nodes. No history of splenectomy.  PSYCHIATRIC: No history of depression or anxiety.  ENDOCRINOLOGIC: No reports of sweating, cold or heat intolerance. No polyuria or polydipsia.  [name] Kitchen   Physical Examination Today's Vitals   02/25/20 0854  BP: (!) 127/87  Pulse: 96  SpO2: 96%  Weight: 145 lb 6.4 oz (66 kg)  Height: 5\' 5"  (1.651 m)   Body mass index is 24.2 kg/m.  Gen: resting comfortably, no acute distress HEENT: no scleral icterus, pupils equal round and reactive, no palptable cervical adenopathy,  CV: RRR, no m/r/g, no jvd Resp: Clear to auscultation bilaterally GI: abdomen is soft, non-tender, non-distended, normal bowel sounds, no hepatosplenomegaly MSK: extremities are warm, no edema.  Skin: warm, no rash Neuro:  no focal deficits Psych: appropriate affect   Diagnostic Studies  01/2020 echo IMPRESSIONS    1. Left ventricular ejection fraction, by estimation, is 65 to 70%. The  left ventricle has normal function. The left ventricle has no regional  wall  motion abnormalities. Left ventricular diastolic parameters were  normal.  2. Right ventricular systolic function is normal. The right ventricular  size is normal. Tricuspid regurgitation signal is inadequate for assessing  PA pressure.  3. The mitral valve is grossly normal. Trivial mitral valve  regurgitation.  4. The aortic valve is tricuspid. Aortic valve regurgitation is not  visualized.  5. The inferior vena cava is normal in size with greater than 50%  respiratory variability, suggesting right atrial pressure of 3 mmHg.   01/2020 MRI brain IMPRESSION: Normal MRI brain.    Assessment and Plan  1. Syncope - 2 isolated episodes, both assoicated with being in high temperatures outdoors for extended period of time.  Somewhat poor oral hydration given high caffeinated drink intake, alcohol regularly during the week. Also he reprots some high blood sugars which also could play a role in volume status - stucturally normal heart by echo, baseline EKG shows NSR - no further workup at this time, if recurrent episode would plan for outpatient monitor. We discussed aggressive hydration.       Arnoldo Lenis, M.D.

## 2020-02-25 NOTE — Patient Instructions (Signed)
Medication Instructions:  Continue all current medications.  Labwork: none  Testing/Procedures: none  Follow-Up: 3 months   Any Other Special Instructions Will Be Listed Below (If Applicable).  If you need a refill on your cardiac medications before your next appointment, please call your pharmacy.  

## 2020-02-25 NOTE — Telephone Encounter (Signed)
  Patient Consent for Virtual Visit         Justin [name] has provided verbal consent on 02/25/2020 for a virtual visit (video or telephone).   CONSENT FOR VIRTUAL VISIT FOR:  Justin [name]  By participating in this virtual visit I agree to the following:  I hereby voluntarily request, consent and authorize [facility] and its employed or contracted physicians, physician assistants, nurse practitioners or other licensed health care professionals (the Practitioner), to provide me with telemedicine health care services (the "Services") as deemed necessary by the treating Practitioner. I acknowledge and consent to receive the Services by the Practitioner via telemedicine. I understand that the telemedicine visit will involve communicating with the Practitioner through live audiovisual communication technology and the disclosure of certain medical information by electronic transmission. I acknowledge that I have been given the opportunity to request an in-person assessment or other available alternative prior to the telemedicine visit and am voluntarily participating in the telemedicine visit.  I understand that I have the right to withhold or withdraw my consent to the use of telemedicine in the course of my care at any time, without affecting my right to future care or treatment, and that the Practitioner or I may terminate the telemedicine visit at any time. I understand that I have the right to inspect all information obtained and/or recorded in the course of the telemedicine visit and may receive copies of available information for a reasonable fee.  I understand that some of the potential risks of receiving the Services via telemedicine include:  [name] Kitchen Delay or interruption in medical evaluation due to technological equipment failure or disruption; . Information transmitted may not be sufficient (e.g. poor resolution of images) to allow for appropriate medical decision making by the Practitioner;  and/or  . In rare instances, security protocols could fail, causing a breach of personal health information.  Furthermore, I acknowledge that it is my responsibility to provide information about my medical history, conditions and care that is complete and accurate to the best of my ability. I acknowledge that Practitioner's advice, recommendations, and/or decision may be based on factors not within their control, such as incomplete or inaccurate data provided by me or distortions of diagnostic images or specimens that may result from electronic transmissions. I understand that the practice of medicine is not an exact science and that Practitioner makes no warranties or guarantees regarding treatment outcomes. I acknowledge that a copy of this consent can be made available to me via my patient portal ([facility]), or I can request a printed copy by calling the office of [facility].    I understand that my insurance will be billed for this visit.   I have read or had this consent read to me. . I understand the contents of this consent, which adequately explains the benefits and risks of the Services being provided via telemedicine.  . I have been provided ample opportunity to ask questions regarding this consent and the Services and have had my questions answered to my satisfaction. . I give my informed consent for the services to be provided through the use of telemedicine in my medical care

## 2020-03-05 ENCOUNTER — Other Ambulatory Visit (INDEPENDENT_AMBULATORY_CARE_PROVIDER_SITE_OTHER): Payer: Self-pay | Admitting: Internal Medicine

## 2020-03-06 ENCOUNTER — Ambulatory Visit (INDEPENDENT_AMBULATORY_CARE_PROVIDER_SITE_OTHER): Payer: 59 | Admitting: Gastroenterology

## 2020-03-06 ENCOUNTER — Other Ambulatory Visit (INDEPENDENT_AMBULATORY_CARE_PROVIDER_SITE_OTHER): Payer: Self-pay | Admitting: Internal Medicine

## 2020-03-06 MED ORDER — INSULIN GLARGINE 100 UNIT/ML ~~LOC~~ SOLN: 20.0000 [IU] | Freq: Every day | SUBCUTANEOUS | 3 refills | Status: DC

## 2020-03-06 MED ORDER — TRULICITY 3 MG/0.5ML ~~LOC~~ SOAJ
3.0000 mg | SUBCUTANEOUS | 3 refills | Status: DC
Start: 2020-03-06 — End: 2020-10-17

## 2020-03-09 ENCOUNTER — Encounter (INDEPENDENT_AMBULATORY_CARE_PROVIDER_SITE_OTHER): Payer: Self-pay | Admitting: Gastroenterology

## 2020-03-09 ENCOUNTER — Other Ambulatory Visit: Payer: Self-pay

## 2020-03-09 ENCOUNTER — Encounter (INDEPENDENT_AMBULATORY_CARE_PROVIDER_SITE_OTHER): Payer: Self-pay | Admitting: *Deleted

## 2020-03-09 ENCOUNTER — Ambulatory Visit (INDEPENDENT_AMBULATORY_CARE_PROVIDER_SITE_OTHER): Payer: 59 | Admitting: Gastroenterology

## 2020-03-09 ENCOUNTER — Telehealth (INDEPENDENT_AMBULATORY_CARE_PROVIDER_SITE_OTHER): Payer: Self-pay | Admitting: *Deleted

## 2020-03-09 DIAGNOSIS — R634 Abnormal weight loss: Secondary | ICD-10-CM | POA: Diagnosis not present

## 2020-03-09 DIAGNOSIS — F1010 Alcohol abuse, uncomplicated: Secondary | ICD-10-CM

## 2020-03-09 DIAGNOSIS — Z860100 Personal history of colon polyps, unspecified: Secondary | ICD-10-CM

## 2020-03-09 DIAGNOSIS — Z800 Family history of malignant neoplasm of digestive organs: Secondary | ICD-10-CM | POA: Diagnosis not present

## 2020-03-09 MED ORDER — PLENVU 140 G PO SOLR
1.0000 | Freq: Once | ORAL | 0 refills | Status: AC
Start: 2020-03-09 — End: 2020-03-09

## 2020-03-09 NOTE — Telephone Encounter (Signed)
Patient needs Plenvu (copay card) ° °

## 2020-03-09 NOTE — Patient Instructions (Signed)
Schedule colonoscopy If weight loss happens again, please call us to schedule endoscopy Patient was counseled regarding the importance of alcohol cessation. The patient was informed about the long term effects of continuous alcohol intake.

## 2020-03-09 NOTE — Progress Notes (Signed)
Justin [name], M.D. Gastroenterology & Hepatology [facility] [address] [place], [place] [address] Primary Care Physician: Doree Albee, MD [place] [address]  Referring MD: PCP  I will communicate my assessment and recommendations to the referring MD via EMR. Note: Occasional unusual wording and randomly placed punctuation marks may result from the use of speech recognition technology to transcribe this document"  Chief Complaint: Weight loss  History of Present Illness: Justin [name] is a 49 y.o. male with PMH DM and HTN, who presents for evaluation of unintentional weight loss in the past.  The patient reports he had two episodes of syncope during late May and mid June which made him and his wife very concerned.  He recovered consciousness spontaneously after a few seconds. He states he was working in his yard and playing in the golf course when both events happened.  The patient also says since March 2021 he lost 11 lb (decreased to 141 lb) but states he has gained recently weight again -he currently is 148 lb. he states that he was exercising more than usual in the past but he is trying to decrease his activity recently after the syncopal episode happened.  He has been taking Trulicity recently as well to control his diabetes.  Otherwise, the patient denies any other symptomatology such as nausea, vomiting, fever, chills, hematochezia, melena, hematemesis, abdominal distention, abdominal pain, diarrhea, jaundice, pruritus, shortness of breath, chest pain, fatigue.  Last EGD never Last Colonoscopy: 2013 - two small polyps (one was a tubular adenoma), diverticulosis   FHx: neg for any gastrointestinal/liver disease, father prostate cancer, grandmother colon cancer in her 12s, grandfather prostate cancer Social: smokes couple of cigars a day, drinks alcohol - liquor 4-5 shots 4-5 times a week, neg illicit drug  use Surgical: non contributory  Past Medical History: Past Medical History:  Diagnosis Date  . Diabetes mellitus ([place])    Type 2 x 9 yrs.  . Hyperlipidemia   . Hypertension    x 5 yrs    Past Surgical History: Past Surgical History:  Procedure Laterality Date  .  rt knee arthroscopy    . COLONOSCOPY  04/10/2012   Procedure: COLONOSCOPY;  Surgeon: Rogene Houston, MD;  Location: [facility];  Service: Endoscopy;  Laterality: N/A;  1200    Family History:History reviewed. No pertinent family history.  Social History: Social History   Tobacco Use  Smoking Status Current Every Day Smoker  Smokeless Tobacco Never Used  Tobacco Comment   Cigar a day   Social History   Substance and Sexual Activity  Alcohol Use Yes   Comment: 6 pack a week   Social History   Substance and Sexual Activity  Drug Use No    Allergies: Allergies  Allergen Reactions  . Codeine     Hives, itching     Medications: Current Outpatient Medications  Medication Sig Dispense Refill  . amLODipine (NORVASC) 5 MG tablet Take 1 tablet (5 mg total) by mouth daily. 90 tablet 1  . benazepril (LOTENSIN) 40 MG tablet Take 1 tablet (40 mg total) by mouth daily. 90 tablet 0  . Dulaglutide (TRULICITY) 3 XN/1.7GY SOPN Inject 3 mg into the skin once a week. 4 pen 3  . Dulaglutide (TRULICITY) 3 FV/4.9SW SOPN Inject 0.5 mLs (3 mg total) as directed once a week. 2 mL 3  . empagliflozin (JARDIANCE) 25 MG TABS tablet Take 25 mg by mouth daily  before breakfast. 30 tablet 3  . insulin glargine (LANTUS) 100 UNIT/ML injection Inject 0.2 mLs (20 Units total) into the skin daily. 10 mL 3  . Insulin Pen Needle (BD PEN NEEDLE NANO U/F) 32G X 4 MM MISC Use to check blood sugar daily 300 each 3   No current facility-administered medications for this visit.    Review of Systems: GENERAL: negative for malaise, night sweats HEENT: No changes in hearing or vision, no nose bleeds or other nasal problems. NECK:  Negative for lumps, goiter, pain and significant neck swelling RESPIRATORY: Negative for cough, wheezing CARDIOVASCULAR: Negative for chest pain, leg swelling, palpitations, orthopnea GI: SEE HPI MUSCULOSKELETAL: Negative for joint pain or swelling, back pain, and muscle pain. SKIN: Negative for lesions, rash PSYCH: Negative for sleep disturbance, mood disorder and recent psychosocial stressors. HEMATOLOGY Negative for prolonged bleeding, bruising easily, and swollen nodes. ENDOCRINE: Negative for cold or heat intolerance, polyuria, polydipsia and goiter. NEURO: negative for tremor, gait imbalance, syncope and seizures. The remainder of the review of systems is noncontributory.   Physical Exam: BP (!) 143/91 (BP Location: Right Arm, Patient Position: Sitting, Cuff Size: Normal)   Pulse 98   Temp 98.5 F (36.9 C) (Oral)   Ht 5\' 5"  (1.651 m)   Wt 148 lb (67.1 kg)   BMI 24.63 kg/m  GENERAL: The patient is AO x3, in no acute distress. HEENT: Head is normocephalic and atraumatic. EOMI are intact. Mouth is well hydrated and without lesions. NECK: Supple. No masses LUNGS: Clear to auscultation. No presence of rhonchi/wheezing/rales. Adequate chest expansion HEART: RRR, normal s1 and s2. ABDOMEN: Soft, nontender, no guarding, no peritoneal signs, and nondistended. BS +. No masses. EXTREMITIES: Without any cyanosis, clubbing, rash, lesions or edema. NEUROLOGIC: AOx3, no focal motor deficit. SKIN: no jaundice, no rashes   Imaging/Labs: as above  I personally reviewed and interpreted the available labs, imaging and endoscopic files.  Impression and Plan: Justin [name] is a 49 y.o. male with PMH DM and HTN, who presents for evaluation of unintentional weight loss in the past.  The patient had initial significant weight loss which was likely secondary to the intake of Trulicity and increased exercise, however this has stopped and actually she has gained some weight back.  He has not  presented any other red flag signs that would warrant further work-up at this point.  However, the patient has a history of colonic polyps for which he is due for surveillance.  Importantly, one of his 2nd line family members was diagnosed with colon cancer before age 47 which increases his risk overall.  We will order a colonoscopy for this.  On the other hand, the patient was counseled about the importance of alcohol cessation as this will have significant impact in his liver and multiple systems.  The patient understood and will try to decrease his alcohol intake.  - Schedule colonoscopy - If weight loss happens again, please call us to schedule EGD - Patient was counseled regarding the importance of alcohol cessation. The patient was informed about the long term effects of continuous alcohol intake.   All questions were answered.      Justin Peppers, MD Gastroenterology and Hepatology [facility]

## 2020-03-10 ENCOUNTER — Other Ambulatory Visit (INDEPENDENT_AMBULATORY_CARE_PROVIDER_SITE_OTHER): Payer: Self-pay | Admitting: *Deleted

## 2020-03-13 ENCOUNTER — Ambulatory Visit (INDEPENDENT_AMBULATORY_CARE_PROVIDER_SITE_OTHER): Payer: 59

## 2020-03-15 ENCOUNTER — Encounter (HOSPITAL_COMMUNITY)
Admission: RE | Admit: 2020-03-15 | Discharge: 2020-03-15 | Disposition: A | Discharge status: Home / Self Care | Payer: 59 | Source: Ambulatory Visit | Attending: Gastroenterology | Admitting: Gastroenterology

## 2020-03-15 ENCOUNTER — Other Ambulatory Visit: Payer: Self-pay

## 2020-03-17 ENCOUNTER — Other Ambulatory Visit (HOSPITAL_COMMUNITY): Payer: 59

## 2020-03-17 ENCOUNTER — Other Ambulatory Visit (HOSPITAL_COMMUNITY)
Admission: RE | Admit: 2020-03-17 | Discharge: 2020-03-17 | Disposition: A | Discharge status: Home / Self Care | Payer: 59 | Source: Ambulatory Visit | Attending: Gastroenterology | Admitting: Gastroenterology

## 2020-03-17 ENCOUNTER — Other Ambulatory Visit: Payer: Self-pay

## 2020-03-17 DIAGNOSIS — Z01812 Encounter for preprocedural laboratory examination: Secondary | ICD-10-CM | POA: Diagnosis present

## 2020-03-17 DIAGNOSIS — Z20822 Contact with and (suspected) exposure to covid-19: Secondary | ICD-10-CM | POA: Insufficient documentation

## 2020-03-17 LAB — SARS CORONAVIRUS 2 (TAT 6-24 HRS): SARS Coronavirus 2: NEGATIVE

## 2020-03-21 ENCOUNTER — Encounter (HOSPITAL_COMMUNITY): Payer: Self-pay | Admitting: Gastroenterology

## 2020-03-21 ENCOUNTER — Ambulatory Visit (HOSPITAL_COMMUNITY): Payer: 59 | Admitting: Anesthesiology

## 2020-03-21 ENCOUNTER — Ambulatory Visit (HOSPITAL_COMMUNITY)
Admission: RE | Admit: 2020-03-21 | Discharge: 2020-03-21 | Disposition: A | Discharge status: Home / Self Care | Payer: 59 | Attending: Gastroenterology | Admitting: Gastroenterology

## 2020-03-21 ENCOUNTER — Encounter (HOSPITAL_COMMUNITY)
Admission: RE | Disposition: A | Discharge status: Home / Self Care | Payer: Self-pay | Source: Home / Self Care | Attending: Gastroenterology

## 2020-03-21 ENCOUNTER — Other Ambulatory Visit: Payer: Self-pay

## 2020-03-21 DIAGNOSIS — Z794 Long term (current) use of insulin: Secondary | ICD-10-CM | POA: Insufficient documentation

## 2020-03-21 DIAGNOSIS — Z86010 Personal history of colonic polyps: Secondary | ICD-10-CM | POA: Insufficient documentation

## 2020-03-21 DIAGNOSIS — Z1211 Encounter for screening for malignant neoplasm of colon: Secondary | ICD-10-CM | POA: Diagnosis present

## 2020-03-21 DIAGNOSIS — K5730 Diverticulosis of large intestine without perforation or abscess without bleeding: Secondary | ICD-10-CM | POA: Diagnosis not present

## 2020-03-21 DIAGNOSIS — D120 Benign neoplasm of cecum: Secondary | ICD-10-CM | POA: Diagnosis not present

## 2020-03-21 DIAGNOSIS — D123 Benign neoplasm of transverse colon: Secondary | ICD-10-CM | POA: Insufficient documentation

## 2020-03-21 DIAGNOSIS — Z79899 Other long term (current) drug therapy: Secondary | ICD-10-CM | POA: Insufficient documentation

## 2020-03-21 DIAGNOSIS — D124 Benign neoplasm of descending colon: Secondary | ICD-10-CM | POA: Diagnosis not present

## 2020-03-21 DIAGNOSIS — E119 Type 2 diabetes mellitus without complications: Secondary | ICD-10-CM | POA: Diagnosis not present

## 2020-03-21 DIAGNOSIS — I10 Essential (primary) hypertension: Secondary | ICD-10-CM | POA: Insufficient documentation

## 2020-03-21 DIAGNOSIS — E785 Hyperlipidemia, unspecified: Secondary | ICD-10-CM | POA: Diagnosis not present

## 2020-03-21 DIAGNOSIS — Z885 Allergy status to narcotic agent status: Secondary | ICD-10-CM | POA: Diagnosis not present

## 2020-03-21 DIAGNOSIS — Z800 Family history of malignant neoplasm of digestive organs: Secondary | ICD-10-CM | POA: Diagnosis not present

## 2020-03-21 DIAGNOSIS — F17200 Nicotine dependence, unspecified, uncomplicated: Secondary | ICD-10-CM | POA: Diagnosis not present

## 2020-03-21 DIAGNOSIS — K648 Other hemorrhoids: Secondary | ICD-10-CM | POA: Insufficient documentation

## 2020-03-21 HISTORY — PX: POLYPECTOMY: SHX5525

## 2020-03-21 HISTORY — PX: BIOPSY: SHX5522

## 2020-03-21 HISTORY — PX: COLONOSCOPY WITH PROPOFOL: SHX5780

## 2020-03-21 LAB — GLUCOSE, CAPILLARY: Glucose-Capillary: 124 mg/dL — ABNORMAL HIGH (ref 70–99)

## 2020-03-21 SURGERY — COLONOSCOPY WITH PROPOFOL
Anesthesia: General

## 2020-03-21 MED ORDER — PROPOFOL 10 MG/ML IV BOLUS
INTRAVENOUS | Status: AC
  Filled 2020-03-21: qty 40

## 2020-03-21 MED ORDER — PROPOFOL 10 MG/ML IV BOLUS
INTRAVENOUS | Status: AC
  Filled 2020-03-21: qty 80

## 2020-03-21 MED ORDER — STERILE WATER FOR IRRIGATION IR SOLN
Status: DC | PRN
  Administered 2020-03-21: 1.5 mL

## 2020-03-21 MED ORDER — PROPOFOL 500 MG/50ML IV EMUL
INTRAVENOUS | Status: DC | PRN
  Administered 2020-03-21: 150 ug/kg/min via INTRAVENOUS

## 2020-03-21 MED ORDER — LACTATED RINGERS IV SOLN: INTRAVENOUS | Status: DC

## 2020-03-21 NOTE — Discharge Instructions (Signed)
Colonoscopy, Adult, Care After This sheet gives you information about how to care for yourself after your procedure. Your doctor may also give you more specific instructions. If you have problems or questions, call your doctor. What can I expect after the procedure? After the procedure, it is common to have:  A small amount of blood in your poop (stool) for 24 hours.  Some gas.  Mild cramping or bloating in your belly (abdomen). Follow these instructions at home: Eating and drinking   Drink enough fluid to keep your pee (urine) pale yellow.  Follow instructions from your doctor about what you cannot eat or drink.  Return to your normal diet as told by your doctor. Avoid heavy or fried foods that are hard to digest. Activity  Rest as told by your doctor.  Do not sit for a long time without moving. Get up to take short walks every 1-2 hours. This is important. Ask for help if you feel weak or unsteady.  Return to your normal activities as told by your doctor. Ask your doctor what activities are safe for you. To help cramping and bloating:   Try walking around.  Put heat on your belly as told by your doctor. Use the heat source that your doctor recommends, such as a moist heat pack or a heating pad. ? Put a towel between your skin and the heat source. ? Leave the heat on for 20-30 minutes. ? Remove the heat if your skin turns bright red. This is very important if you are unable to feel pain, heat, or cold. You may have a greater risk of getting burned. General instructions  For the first 24 hours after the procedure: ? Do not drive or use machinery. ? Do not sign important documents. ? Do not drink alcohol. ? Do your daily activities more slowly than normal. ? Eat foods that are soft and easy to digest.  Take over-the-counter or prescription medicines only as told by your doctor.  Keep all follow-up visits as told by your doctor. This is important. Contact a doctor  if:  You have blood in your poop 2-3 days after the procedure. Get help right away if:  You have more than a small amount of blood in your poop.  You see large clumps of tissue (blood clots) in your poop.  Your belly is swollen.  You feel like you may vomit (nauseous).  You vomit.  You have a fever.  You have belly pain that gets worse, and medicine does not help your pain. Summary  After the procedure, it is common to have a small amount of blood in your poop. You may also have mild cramping and bloating in your belly.  For the first 24 hours after the procedure, do not drive or use machinery, do not sign important documents, and do not drink alcohol.  Get help right away if you have a lot of blood in your poop, feel like you may vomit, have a fever, or have more belly pain. This information is not intended to replace advice given to you by your health care provider. Make sure you discuss any questions you have with your health care provider. Document Revised: 02/08/2019 Document Reviewed: 02/08/2019 Elsevier Patient Education  [place]. Colon Polyps  Polyps are tissue growths inside the body. Polyps can grow in many places, including the large intestine (colon). A polyp may be a round bump or a mushroom-shaped growth. You could have one polyp or several. Most  colon polyps are noncancerous (benign). However, some colon polyps can become cancerous over time. Finding and removing the polyps early can help prevent this. What are the causes? The exact cause of colon polyps is not known. What increases the risk? You are more likely to develop this condition if you:  Have a family history of colon cancer or colon polyps.  Are older than 10 or older than 45 if you are African American.  Have inflammatory bowel disease, such as ulcerative colitis or Crohn's disease.  Have certain hereditary conditions, such as: ? Familial adenomatous polyposis. ? Lynch  syndrome. ? Turcot syndrome. ? Peutz-Jeghers syndrome.  Are overweight.  Smoke cigarettes.  Do not get enough exercise.  Drink too much alcohol.  Eat a diet that is high in fat and red meat and low in fiber.  Had childhood cancer that was treated with abdominal radiation. What are the signs or symptoms? Most polyps do not cause symptoms. If you have symptoms, they may include:  Blood coming from your rectum when having a bowel movement.  Blood in your stool. The stool may look dark red or black.  Abdominal pain.  A change in bowel habits, such as constipation or diarrhea. How is this diagnosed? This condition is diagnosed with a colonoscopy. This is a procedure in which a lighted, flexible scope is inserted into the anus and then passed into the colon to examine the area. Polyps are sometimes found when a colonoscopy is done as part of routine cancer screening tests. How is this treated? Treatment for this condition involves removing any polyps that are found. Most polyps can be removed during a colonoscopy. Those polyps will then be tested for cancer. Additional treatment may be needed depending on the results of testing. Follow these instructions at home: Lifestyle  Maintain a healthy weight, or lose weight if recommended by your health care provider.  Exercise every day or as told by your health care provider.  Do not use any products that contain nicotine or tobacco, such as cigarettes and e-cigarettes. If you need help quitting, ask your health care provider.  If you drink alcohol, limit how much you have: ? 0-1 drink a day for women. ? 0-2 drinks a day for men.  Be aware of how much alcohol is in your drink. In the [place] one drink equals one 12 oz bottle of beer (355 mL), one 5 oz glass of wine (148 mL), or one 1 oz shot of hard liquor (44 mL). Eating and drinking   Eat foods that are high in fiber, such as fruits, vegetables, and whole grains.  Eat foods that  are high in calcium and vitamin D, such as milk, cheese, yogurt, eggs, liver, fish, and broccoli.  Limit foods that are high in fat, such as fried foods and desserts.  Limit the amount of red meat and processed meat you eat, such as hot dogs, sausage, bacon, and lunch meats. General instructions  Keep all follow-up visits as told by your health care provider. This is important. ? This includes having regularly scheduled colonoscopies. ? Talk to your health care provider about when you need a colonoscopy. Contact a health care provider if:  You have new or worsening bleeding during a bowel movement.  You have new or increased blood in your stool.  You have a change in bowel habits.  You lose weight for no known reason. Summary  Polyps are tissue growths inside the body. Polyps can grow in many places,  including the colon.  Most colon polyps are noncancerous (benign), but some can become cancerous over time.  This condition is diagnosed with a colonoscopy.  Treatment for this condition involves removing any polyps that are found. Most polyps can be removed during a colonoscopy. This information is not intended to replace advice given to you by your health care provider. Make sure you discuss any questions you have with your health care provider. Document Revised: 10/30/2017 Document Reviewed: 10/30/2017 Elsevier Patient Education  [place]. High-Fiber Diet Fiber, also called dietary fiber, is a type of carbohydrate that is found in fruits, vegetables, whole grains, and beans. A high-fiber diet can have many health benefits. Your health care provider may recommend a high-fiber diet to help:  Prevent constipation. Fiber can make your bowel movements more regular.  Lower your cholesterol.  Relieve the following conditions: ? Swelling of veins in the anus (hemorrhoids). ? Swelling and irritation (inflammation) of specific areas of the digestive tract (uncomplicated  diverticulosis). ? A problem of the large intestine (colon) that sometimes causes pain and diarrhea (irritable bowel syndrome, IBS).  Prevent overeating as part of a weight-loss plan.  Prevent heart disease, type 2 diabetes, and certain cancers. What is my plan? The recommended daily fiber intake in grams (g) includes:  38 g for men age 17 or younger.  30 g for men over age 77.  65 g for women age 79 or younger.  21 g for women over age 46. You can get the recommended daily intake of dietary fiber by:  Eating a variety of fruits, vegetables, grains, and beans.  Taking a fiber supplement, if it is not possible to get enough fiber through your diet. What do I need to know about a high-fiber diet?  It is better to get fiber through food sources rather than from fiber supplements. There is not a lot of research about how effective supplements are.  Always check the fiber content on the nutrition facts label of any prepackaged food. Look for foods that contain 5 g of fiber or more per serving.  Talk with a diet and nutrition specialist (dietitian) if you have questions about specific foods that are recommended or not recommended for your medical condition, especially if those foods are not listed below.  Gradually increase how much fiber you consume. If you increase your intake of dietary fiber too quickly, you may have bloating, cramping, or gas.  Drink plenty of water. Water helps you to digest fiber. What are tips for following this plan?  Eat a wide variety of high-fiber foods.  Make sure that half of the grains that you eat each day are whole grains.  Eat breads and cereals that are made with whole-grain flour instead of refined flour or white flour.  Eat brown rice, bulgur wheat, or millet instead of white rice.  Start the day with a breakfast that is high in fiber, such as a cereal that contains 5 g of fiber or more per serving.  Use beans in place of meat in soups,  salads, and pasta dishes.  Eat high-fiber snacks, such as berries, raw vegetables, nuts, and popcorn.  Choose whole fruits and vegetables instead of processed forms like juice or sauce. What foods can I eat?  Fruits Berries. Pears. Apples. Oranges. Avocado. Prunes and raisins. Dried figs. Vegetables Sweet potatoes. Spinach. Kale. Artichokes. Cabbage. Broccoli. Cauliflower. Green peas. Carrots. Squash. Grains Whole-grain breads. Multigrain cereal. Oats and oatmeal. Brown rice. Barley. Bulgur wheat. [place]. Quinoa.  Bran muffins. Popcorn. Rye wafer crackers. Meats and other proteins Navy, kidney, and pinto beans. Soybeans. Split peas. Lentils. Nuts and seeds. Dairy Fiber-fortified yogurt. Beverages Fiber-fortified soy milk. Fiber-fortified orange juice. Other foods Fiber bars. The items listed above may not be a complete list of recommended foods and beverages. Contact a dietitian for more options. What foods are not recommended? Fruits Fruit juice. Cooked, strained fruit. Vegetables Fried potatoes. Canned vegetables. Well-cooked vegetables. Grains White bread. Pasta made with refined flour. White rice. Meats and other proteins Fatty cuts of meat. Fried chicken or fried fish. Dairy Milk. Yogurt. Cream cheese. Sour cream. Fats and oils Butters. Beverages Soft drinks. Other foods Cakes and pastries. The items listed above may not be a complete list of foods and beverages to avoid. Contact a dietitian for more information. Summary  Fiber is a type of carbohydrate. It is found in fruits, vegetables, whole grains, and beans.  There are many health benefits of eating a high-fiber diet, such as preventing constipation, lowering blood cholesterol, helping with weight loss, and reducing your risk of heart disease, diabetes, and certain cancers.  Gradually increase your intake of fiber. Increasing too fast can result in cramping, bloating, and gas. Drink plenty of water while you  increase your fiber.  The best sources of fiber include whole fruits and vegetables, whole grains, nuts, seeds, and beans. This information is not intended to replace advice given to you by your health care provider. Make sure you discuss any questions you have with your health care provider. Document Revised: 05/19/2017 Document Reviewed: 05/19/2017 Elsevier Patient Education  2020 [place] are being discharged to home.  Eat a high fiber diet.  We are waiting for your pathology results.  Your physician has recommended a repeat colonoscopy (date to be determined after pending pathology results are reviewed) for surveillance.

## 2020-03-21 NOTE — Anesthesia Postprocedure Evaluation (Signed)
Anesthesia Post Note  Patient: Justin [name]  Procedure(s) Performed: COLONOSCOPY WITH PROPOFOL (N/A ) POLYPECTOMY BIOPSY  Patient location during evaluation: Endoscopy Anesthesia Type: General Level of consciousness: awake, oriented, awake and alert and patient cooperative Pain management: pain level controlled Vital Signs Assessment: post-procedure vital signs reviewed and stable Respiratory status: spontaneous breathing, respiratory function stable and nonlabored ventilation Cardiovascular status: blood pressure returned to baseline and stable Postop Assessment: no headache and no backache Anesthetic complications: no   No complications documented.   Last Vitals:  Vitals:   03/21/20 0842 03/21/20 1104  BP: (!) 135/91 119/88  Pulse: 90 88  Resp: 16 14  Temp: 37.1 C 36.5 C  SpO2: 99% 98%    Last Pain:  Vitals:   03/21/20 1104  TempSrc: Oral  PainSc:                  Tacy Learn

## 2020-03-21 NOTE — Anesthesia Preprocedure Evaluation (Signed)
Anesthesia Evaluation  Patient identified by MRN, date of birth, ID band Patient awake    Reviewed: Allergy & Precautions, H&P , NPO status , Patient's Chart, lab work & pertinent test results, reviewed documented beta blocker date and time   Airway Mallampati: II  TM Distance: >3 FB Neck ROM: full    Dental no notable dental hx.    Pulmonary neg pulmonary ROS, Current Smoker and Patient abstained from smoking.,    Pulmonary exam normal breath sounds clear to auscultation       Cardiovascular Exercise Tolerance: Good hypertension, negative cardio ROS   Rhythm:regular Rate:Normal     Neuro/Psych PSYCHIATRIC DISORDERS negative neurological ROS     GI/Hepatic negative GI ROS, Neg liver ROS,   Endo/Other  negative endocrine ROSdiabetes  Renal/GU negative Renal ROS  negative genitourinary   Musculoskeletal   Abdominal   Peds  Hematology negative hematology ROS (+)   Anesthesia Other Findings   Reproductive/Obstetrics negative OB ROS                             Anesthesia Physical Anesthesia Plan  ASA: II  Anesthesia Plan: General   Post-op Pain Management:    Induction:   PONV Risk Score and Plan: Propofol infusion  Airway Management Planned:   Additional Equipment:   Intra-op Plan:   Post-operative Plan:   Informed Consent: I have reviewed the patients History and Physical, chart, labs and discussed the procedure including the risks, benefits and alternatives for the proposed anesthesia with the patient or authorized representative who has indicated his/her understanding and acceptance.     Dental Advisory Given  Plan Discussed with: CRNA  Anesthesia Plan Comments:         Anesthesia Quick Evaluation

## 2020-03-21 NOTE — H&P (Signed)
Justin [name] is an 49 y.o. male.   Chief Complaint: Colon polyps HPI:49 y.o. male with PMH DM and HTN, who comes to the hospital to undergo colonoscopy for surveillance of previous polyps.  The patient had his last colonoscopy in 2013, he was found to have 2 small polyps in his colon 1 of which was a tubular adenoma, as well as diverticulosis.  Family history is remarkable for grandmother with colon cancer in her 20s.  The patient denies having any abdominal pain, nausea, vomiting, changes in the color or consistency of his stool.  Past Medical History:  Diagnosis Date  . Diabetes mellitus ([place])    Type 2 x 9 yrs.  . Hyperlipidemia   . Hypertension    x 5 yrs    Past Surgical History:  Procedure Laterality Date  .  rt knee arthroscopy    . COLONOSCOPY  04/10/2012   Procedure: COLONOSCOPY;  Surgeon: Rogene Houston, MD;  [facility];  Service: Endoscopy;  Laterality: N/A;  1200    History reviewed. No pertinent family history. Social History:  reports that he has been smoking. He has never used smokeless tobacco. He reports current alcohol use. He reports that he does not use drugs.  Allergies:  Allergies  Allergen Reactions  . Codeine     Hives, itching     Medications Prior to Admission  Medication Sig Dispense Refill  . amLODipine (NORVASC) 5 MG tablet Take 1 tablet (5 mg total) by mouth daily. 90 tablet 1  . benazepril (LOTENSIN) 40 MG tablet Take 1 tablet (40 mg total) by mouth daily. 90 tablet 0  . Dulaglutide (TRULICITY) 1.5 ZH/0.8MV SOPN Inject 1.5 mg into the skin every Wednesday.    . empagliflozin (JARDIANCE) 25 MG TABS tablet Take 25 mg by mouth daily before breakfast. 30 tablet 3  . insulin detemir (LEVEMIR) 100 UNIT/ML injection Inject 20 Units into the skin daily.    . Dulaglutide (TRULICITY) 3 HQ/4.6NG SOPN Inject 0.5 mLs (3 mg total) as directed once a week. (Patient not taking: Reported on 03/13/2020) 2 mL 3  . insulin glargine (LANTUS) 100 UNIT/ML  injection Inject 0.2 mLs (20 Units total) into the skin daily. (Patient not taking: Reported on 03/13/2020) 10 mL 3  . Insulin Pen Needle (BD PEN NEEDLE NANO U/F) 32G X 4 MM MISC Use to check blood sugar daily 300 each 3    Results for orders placed or performed during the hospital encounter of 03/21/20 (from the past 48 hour(s))  Glucose, capillary     Status: Abnormal   Collection Time: 03/21/20  8:46 AM  Result Value Ref Range   Glucose-Capillary 124 (H) 70 - 99 mg/dL    Comment: Glucose reference range applies only to samples taken after fasting for at least 8 hours.   No results found.  Review of Systems  Constitutional: Negative.   HENT: Negative.   Eyes: Negative.   Respiratory: Negative.   Cardiovascular: Negative.   Gastrointestinal: Negative.   Endocrine: Negative.   Genitourinary: Negative.   Musculoskeletal: Negative.   Skin: Negative.   Allergic/Immunologic: Negative.   Neurological: Negative.   Hematological: Negative.   Psychiatric/Behavioral: Negative.     Blood pressure (!) 135/91, pulse 90, temperature 98.8 F (37.1 C), temperature source Oral, resp. rate 16, height 5\' 5"  (1.651 m), SpO2 99 %. Physical Exam  GENERAL: The patient is AO x3, in no acute distress. HEENT: Head is normocephalic and atraumatic. EOMI are intact. Mouth is  well hydrated and without lesions. NECK: Supple. No masses LUNGS: Clear to auscultation. No presence of rhonchi/wheezing/rales. Adequate chest expansion HEART: RRR, normal s1 and s2. ABDOMEN: Soft, nontender, no guarding, no peritoneal signs, and nondistended. BS +. No masses. EXTREMITIES: Without any cyanosis, clubbing, rash, lesions or edema. NEUROLOGIC: AOx3, no focal motor deficit. SKIN: no jaundice, no rashes  Assessment/Plan 49 y.o. male with PMH DM and HTN, who comes to the hospital to undergo colonoscopy for surveillance of previous tubular adenoma.  We will proceed with colonoscopy.  Harvel Quale,  MD 03/21/2020, 10:27 AM

## 2020-03-21 NOTE — Op Note (Signed)
[facility] Patient Name: Justin [name] Procedure Date: 03/21/2020 10:27 AM MRN: [id number] Date of Birth: 1971/02/25 Attending MD: [name] ,  CSN: [id number] Age: 49 Admit Type: Outpatient Procedure:                Colonoscopy Indications:              High risk colon cancer surveillance: Personal                            history of colonic polyps Providers:                [name], Janeece Riggers, RN, Lambert Mody, Nelma Rothman, Technician Referring MD:              Medicines:                Monitored Anesthesia Care Complications:            No immediate complications. Estimated Blood Loss:     Estimated blood loss: none. Procedure:                Pre-Anesthesia Assessment:                           - Prior to the procedure, a History and Physical                            was performed, and patient medications, allergies                            and sensitivities were reviewed. The patient's                            tolerance of previous anesthesia was reviewed.                           - The risks and benefits of the procedure and the                            sedation options and risks were discussed with the                            patient. All questions were answered and informed                            consent was obtained.                           - ASA Grade Assessment: II - A patient with mild                            systemic disease.                           After obtaining informed consent, the colonoscope  was passed under direct vision. Throughout the                            procedure, the patient's blood pressure, pulse, and                            oxygen saturations were monitored continuously. The                            [id number]-[id number] ([id number]) scope was introduced through                            the anus and advanced to the the cecum, identified                             by appendiceal orifice and ileocecal valve. The                            colonoscopy was performed without difficulty. The                            patient tolerated the procedure well. Scope                            withdrawal time was 13 minutes. The quality of the                            bowel preparation was good. Scope In: 10:37:16 AM Scope Out: 11:01:58 AM Scope Withdrawal Time: 0 hours 17 minutes 4 seconds  Total Procedure Duration: 0 hours 24 minutes 42 seconds  Findings:      The perianal and digital rectal examinations were normal. Pertinent       negatives include normal sphincter tone.      A 8 mm polyp was found in the cecum. The polyp was sessile. The polyp       was removed with a cold snare. Resection and retrieval were complete.      Two sessile polyps were found in the transverse colon. The polyps were 4       mm in size. These polyps were removed with a cold snare. Resection and       retrieval were complete.      A 1 mm polyp was found in the descending colon. The polyp was sessile.       The polyp was removed with a cold biopsy forceps. Resection and       retrieval were complete.      A few small and large-mouthed diverticula were found in the sigmoid       colon and ascending colon.      Non-bleeding internal hemorrhoids were found during retroflexion. The       hemorrhoids were medium-sized. Impression:               - One 8 mm polyp in the cecum, removed with a cold                            snare. Resected and retrieved.                           -  Two 4 mm polyps in the transverse colon, removed                            with a cold snare. Resected and retrieved.                           - One 1 mm polyp in the descending colon, removed                            with a cold biopsy forceps. Resected and retrieved.                           - Diverticulosis in the sigmoid colon and in the                            ascending colon.                            - Non-bleeding internal hemorrhoids. Moderate Sedation:      Per Anesthesia Care Recommendation:           - Discharge patient to home (ambulatory).                           - High fiber diet.                           - Await pathology results.                           - Repeat colonoscopy date to be determined after                            pending pathology results are reviewed for                            surveillance. Procedure Code(s):        --- Professional ---                           [phone], GC, Colonoscopy, flexible; with removal of                            tumor(s), polyp(s), or other lesion(s) by snare                            technique                           45380, 96, Colonoscopy, flexible; with biopsy,                            single or multiple Diagnosis Code(s):        --- Professional ---                           K63.5, Polyp  of colon                           Z86.010, Personal history of colonic polyps                           K64.8, Other hemorrhoids                           K57.30, Diverticulosis of large intestine without                            perforation or abscess without bleeding CPT copyright 2019 American Medical Association. All rights reserved. The codes documented in this report are preliminary and upon coder review may  be revised to meet current compliance requirements. [name], MD [name],  03/21/2020 11:08:36 AM This report has been signed electronically. Number of Addenda: 0

## 2020-03-21 NOTE — Transfer of Care (Signed)
Immediate Anesthesia Transfer of Care Note  Patient: Justin [name]  Procedure(s) Performed: COLONOSCOPY WITH PROPOFOL (N/A ) POLYPECTOMY BIOPSY  Patient Location: Endoscopy Unit  Anesthesia Type:General  Level of Consciousness: awake, alert , oriented and patient cooperative  Airway & Oxygen Therapy: Patient Spontanous Breathing and Patient connected to nasal cannula oxygen  Post-op Assessment: Report given to RN, Post -op Vital signs reviewed and stable and Patient moving all extremities  Post vital signs: Reviewed and stable  Last Vitals:  Vitals Value Taken Time  BP 119/88 03/21/20 1104  Temp 36.5 C 03/21/20 1104  Pulse 88 03/21/20 1104  Resp 14 03/21/20 1104  SpO2 98 % 03/21/20 1104    Last Pain:  Vitals:   03/21/20 1104  TempSrc: Oral  PainSc:          Complications: No complications documented.

## 2020-03-22 LAB — SURGICAL PATHOLOGY

## 2020-03-23 ENCOUNTER — Encounter (HOSPITAL_COMMUNITY): Payer: Self-pay | Admitting: Gastroenterology

## 2020-03-28 ENCOUNTER — Other Ambulatory Visit (INDEPENDENT_AMBULATORY_CARE_PROVIDER_SITE_OTHER): Payer: Self-pay | Admitting: Internal Medicine

## 2020-04-05 ENCOUNTER — Ambulatory Visit (INDEPENDENT_AMBULATORY_CARE_PROVIDER_SITE_OTHER): Payer: 59 | Admitting: Internal Medicine

## 2020-04-05 ENCOUNTER — Encounter (INDEPENDENT_AMBULATORY_CARE_PROVIDER_SITE_OTHER): Payer: Self-pay | Admitting: Internal Medicine

## 2020-04-05 ENCOUNTER — Other Ambulatory Visit: Payer: Self-pay

## 2020-04-05 VITALS — BP 130/80 | HR 108 | Temp 97.5°F | Ht 65.0 in | Wt 146.8 lb

## 2020-04-05 DIAGNOSIS — E118 Type 2 diabetes mellitus with unspecified complications: Secondary | ICD-10-CM

## 2020-04-05 DIAGNOSIS — E559 Vitamin D deficiency, unspecified: Secondary | ICD-10-CM

## 2020-04-05 DIAGNOSIS — E1165 Type 2 diabetes mellitus with hyperglycemia: Secondary | ICD-10-CM

## 2020-04-05 DIAGNOSIS — IMO0002 Reserved for concepts with insufficient information to code with codable children: Secondary | ICD-10-CM

## 2020-04-05 DIAGNOSIS — I10 Essential (primary) hypertension: Secondary | ICD-10-CM | POA: Diagnosis not present

## 2020-04-05 DIAGNOSIS — Z794 Long term (current) use of insulin: Secondary | ICD-10-CM

## 2020-04-05 NOTE — Progress Notes (Signed)
Metrics: Intervention Frequency ACO  Documented Smoking Status Yearly  Screened one or more times in 24 months  Cessation Counseling or  Active cessation medication Past 24 months  Past 24 months   Guideline developer: UpToDate (See UpToDate for funding source) Date Released: 2014       Wellness Office Visit  Subjective:  Patient ID: Justin [name], male    DOB: 09-08-1970  Age: 48 y.o. MRN: [id number]  CC: This man comes in for follow-up of uncontrolled diabetes, hypertension, hyperlipidemia and vitamin D deficiency. HPI  He continues to struggle with consistency in his diet.  Although he eats 1 meal a day he says, that meal usually ends up being very unhealthy. He continues to take Jardiance, Trulicity and insulin. He continues to take benazepril for his hypertension. Past Medical History:  Diagnosis Date  . Diabetes mellitus ([place])    Type 2 x 9 yrs.  . Hyperlipidemia   . Hypertension    x 5 yrs   Past Surgical History:  Procedure Laterality Date  .  rt knee arthroscopy    . BIOPSY  03/21/2020   Procedure: BIOPSY;  Surgeon: Harvel Quale, MD;  [facility];  Service: Gastroenterology;;  . COLONOSCOPY  04/10/2012   Procedure: COLONOSCOPY;  Surgeon: Rogene Houston, MD;  [facility];  Service: Endoscopy;  Laterality: N/A;  1200  . COLONOSCOPY WITH PROPOFOL N/A 03/21/2020   Procedure: COLONOSCOPY WITH PROPOFOL;  Surgeon: Harvel Quale, MD;  [facility];  Service: Gastroenterology;  Laterality: N/A;  10:00  . POLYPECTOMY  03/21/2020   Procedure: POLYPECTOMY;  Surgeon: Harvel Quale, MD;  [facility];  Service: Gastroenterology;;     History reviewed. No pertinent family history.  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 Smoking status: Current Every Day Smoker  . Smokeless tobacco: Never  Used  . Tobacco comment: Cigar a day  Substance Use Topics  . Alcohol use: Yes    Comment: 6 pack a week    Current Meds  Medication Sig  . benazepril (LOTENSIN) 40 MG tablet Take 1 tablet (40 mg total) by mouth daily.  . Dulaglutide (TRULICITY) 1.5 VE/7.2CN SOPN Inject 1.5 mg into the skin every Wednesday.  . Dulaglutide (TRULICITY) 3 OB/0.9GG SOPN Inject 0.5 mLs (3 mg total) as directed once a week.  . insulin glargine (LANTUS) 100 UNIT/ML injection Inject 0.2 mLs (20 Units total) into the skin daily.  . Insulin Pen Needle (BD PEN NEEDLE NANO U/F) 32G X 4 MM MISC Use to check blood sugar daily  . JARDIANCE 25 MG TABS tablet TAKE 25 MG BY MOUTH DAILY BEFORE BREAKFAST.      Depression screen [facility] 2/9 02/17/2020 06/21/2019 07/08/2017 09/23/2016 01/22/2016  Decreased Interest 0 0 0 0 0  Down, Depressed, Hopeless 0 0 0 0 0  PHQ - 2 Score 0 0 0 0 0     Objective:   Today's Vitals: BP 130/80 (BP Location: Left Arm, Patient Position: Sitting, Cuff Size: Normal)   Pulse (!) 108   Temp (!) 97.5 F (36.4 C) (Temporal)   Ht 5\' 5"  (1.651 m)   Wt 146 lb 12.8 oz (66.6 kg)   SpO2 96%   BMI 24.43 kg/m  Vitals with BMI 04/05/2020 03/21/2020 03/21/2020  Height 5\' 5"  - -  Weight 146 lbs  13 oz - -  BMI 91.79 - -  Systolic 150 569 794  Diastolic 80 77 88  Pulse 801 91 88     Physical Exam  He looks systemically well.  Weight is largely unchanged.  Blood pressure is in a good range.     Assessment   1. Uncontrolled type 2 diabetes mellitus with complication, with long-term current use of insulin ([place])   2. Essential hypertension, benign   3. Vitamin D deficiency       Tests ordered No orders of the defined types were placed in this encounter.    Plan: 1. We discussed nutrition again and we discussed the blue zones with emphasis on plant-based diet including beans on a daily basis and avoiding animal proteins. 2. He will continue with his Trulicity, insulin and Jardiance for  the time being. 3. He will continue with benazepril for his hypertension which is controlling his blood pressure well. 4. I will see him in a couple of months and hopefully he has made consistent changes to his diet.   No orders of the defined types were placed in this encounter.   Doree Albee, MD

## 2020-04-11 ENCOUNTER — Other Ambulatory Visit (INDEPENDENT_AMBULATORY_CARE_PROVIDER_SITE_OTHER): Payer: Self-pay | Admitting: Internal Medicine

## 2020-04-17 ENCOUNTER — Telehealth (INDEPENDENT_AMBULATORY_CARE_PROVIDER_SITE_OTHER): Payer: Self-pay

## 2020-04-17 ENCOUNTER — Other Ambulatory Visit (INDEPENDENT_AMBULATORY_CARE_PROVIDER_SITE_OTHER): Payer: Self-pay | Admitting: Nurse Practitioner

## 2020-04-17 DIAGNOSIS — IMO0002 Reserved for concepts with insufficient information to code with codable children: Secondary | ICD-10-CM

## 2020-04-17 MED ORDER — INSULIN DETEMIR 100 UNIT/ML ~~LOC~~ SOLN: 20.0000 [IU] | Freq: Every day | SUBCUTANEOUS | 3 refills | Status: DC

## 2020-04-17 NOTE — Progress Notes (Signed)
Yes he is aware. Remember he is who called in a & said levmir is what he was on last.

## 2020-04-17 NOTE — Progress Notes (Signed)
I sent Rx for levemir to his pharmacy. Per his insurance's drug formulary lantus should be covered, but since he is asking for a different agent, I sent in levemir. He should still inject 20 units into his subcutaneous tissue daily.  He should stop using the Lantus and not use both insulins.  If he has any questions please let me know.

## 2020-04-17 NOTE — Progress Notes (Signed)
Call Justin [name] to give him update on refill and change. He will go pick up.

## 2020-04-17 NOTE — Progress Notes (Signed)
Nellie, did you talk to him about the changes? I am booked straight with patients. I can try to call him later, but if you can give him a call I would appreciate it.

## 2020-04-17 NOTE — Progress Notes (Signed)
Okay great.  Thank you.

## 2020-04-20 ENCOUNTER — Other Ambulatory Visit (INDEPENDENT_AMBULATORY_CARE_PROVIDER_SITE_OTHER): Payer: Self-pay

## 2020-04-20 DIAGNOSIS — IMO0002 Reserved for concepts with insufficient information to code with codable children: Secondary | ICD-10-CM

## 2020-04-20 MED ORDER — INSULIN DETEMIR 100 UNIT/ML ~~LOC~~ SOLN: 20.0000 [IU] | Freq: Every day | SUBCUTANEOUS | 3 refills | Status: DC

## 2020-05-01 ENCOUNTER — Other Ambulatory Visit (INDEPENDENT_AMBULATORY_CARE_PROVIDER_SITE_OTHER): Payer: Self-pay

## 2020-05-01 DIAGNOSIS — IMO0002 Reserved for concepts with insufficient information to code with codable children: Secondary | ICD-10-CM

## 2020-05-01 MED ORDER — INSULIN DETEMIR 100 UNIT/ML FLEXPEN: 20.0000 [IU] | PEN_INJECTOR | Freq: Every day | SUBCUTANEOUS | 11 refills | Status: DC

## 2020-05-29 ENCOUNTER — Ambulatory Visit (INDEPENDENT_AMBULATORY_CARE_PROVIDER_SITE_OTHER): Payer: 59 | Admitting: Internal Medicine

## 2020-06-06 ENCOUNTER — Telehealth: Payer: 59 | Admitting: Cardiology

## 2020-06-06 NOTE — Progress Notes (Deleted)
{Choose 1 Note Type (Video or Telephone):[phone]}    Date:  06/06/2020   ID:  Justin [name], DOB June 11, 1971, MRN [id number] The patient was identified using 2 identifiers.  {Patient Location:[phone]::"Home"} {Provider Location:[phone]::"Home Office"}  PCP:  Doree Albee, MD  Cardiologist:  Carlyle Dolly, MD *** Electrophysiologist:  None   Evaluation Performed:  {Choose Visit ZTIW:[id number]::"[id number] Visit"}  Chief Complaint:  ***  History of Present Illness:    Justin [name] is a 49 y.o. male with ***   1. Syncope - 2 total episodes  11/2019 seen in [facility] ER with syncope - reportedly had been outside in high temperatures for several hours. - EKG at that time NSR rate 92 - had covid vaccine x 1   - was out working in the yard for 2-3 hours. Started feeling lightheaded, dizzy. No palpitations - had not ate much, did drink water that day - fell to the floor, seen by neihbor. Out very short period of time  - 2nd episode happened few weeks later - was having a cookout, was sitting at the table and felt very hot. Stood up.  - had been out in the heat with the grill   - not much water daily. Diet cokes 12 oz x 4-5, gatorarde 0 smaller bottles. Liquor whiskey 3 days a week neat x 5 shots The patient {does/does not:200015} have symptoms concerning for COVID-19 infection (fever, chills, cough, or new shortness of breath).    Past Medical History:  Diagnosis Date  . Diabetes mellitus ([place])    Type 2 x 9 yrs.  . Hyperlipidemia   . Hypertension    x 5 yrs   Past Surgical History:  Procedure Laterality Date  .  rt knee arthroscopy    . BIOPSY  03/21/2020   Procedure: BIOPSY;  Surgeon: Harvel Quale, MD;  [facility];  Service: Gastroenterology;;  . COLONOSCOPY  04/10/2012   Procedure: COLONOSCOPY;  Surgeon: Rogene Houston, MD;  [facility];  Service: Endoscopy;  Laterality: N/A;  1200  . COLONOSCOPY WITH  PROPOFOL N/A 03/21/2020   Procedure: COLONOSCOPY WITH PROPOFOL;  Surgeon: Harvel Quale, MD;  [facility];  Service: Gastroenterology;  Laterality: N/A;  10:00  . POLYPECTOMY  03/21/2020   Procedure: POLYPECTOMY;  Surgeon: Montez Morita, Quillian Quince, MD;  [facility];  Service: Gastroenterology;;     No outpatient medications have been marked as taking for the 06/06/20 encounter (Appointment) with Arnoldo Lenis, MD.     Allergies:   Codeine   Social History   Tobacco Use  . Smoking status: Current Every Day Smoker  . Smokeless tobacco: Never Used  . Tobacco comment: Cigar a day  Vaping Use  . Vaping Use: Never used  Substance Use Topics  . Alcohol use: Yes    Comment: 6 pack a week  . Drug use: No     Family Hx: The patient's family history is not on file.  ROS:   Please see the history of present illness.    *** All other systems reviewed and are negative.   Prior CV studies:   The following studies were reviewed today:  01/2020 echo IMPRESSIONS    1. Left ventricular ejection fraction, by estimation, is 65 to 70%. The  left ventricle has normal function. The left ventricle has no regional  wall motion abnormalities. Left ventricular diastolic parameters were  normal.  2. Right ventricular systolic function is normal. The right  ventricular  size is normal. Tricuspid regurgitation signal is inadequate for assessing  PA pressure.  3. The mitral valve is grossly normal. Trivial mitral valve  regurgitation.  4. The aortic valve is tricuspid. Aortic valve regurgitation is not  visualized.  5. The inferior vena cava is normal in size with greater than 50%  respiratory variability, suggesting right atrial pressure of 3 mmHg.   01/2020 MRI brain IMPRESSION: Normal MRI brain.   Labs/Other Tests and Data Reviewed:    EKG:  {EKG/Telemetry Strips Reviewed:[phone]}  Recent Labs: 01/18/2020: Hemoglobin 15.7; Platelets  350; TSH 0.81 02/17/2020: ALT 21; BUN 12; Creat 0.93; Potassium 4.7; Sodium 139   Recent Lipid Panel Lab Results  Component Value Date/Time   CHOL 199 06/21/2019 09:09 AM   TRIG 129 06/21/2019 09:09 AM   HDL 62 06/21/2019 09:09 AM   CHOLHDL 3.2 06/21/2019 09:09 AM   LDLCALC 113 (H) 06/21/2019 09:09 AM    Wt Readings from Last 3 Encounters:  04/05/20 146 lb 12.8 oz (66.6 kg)  03/09/20 148 lb (67.1 kg)  02/25/20 145 lb 6.4 oz (66 kg)     Risk Assessment/Calculations:   {Does this patient have ATRIAL FIBRILLATION?:[phone]}  Objective:    Vital Signs:  There were no vitals taken for this visit.   {[facility] Virtual Exam (Optional):[phone]::"VITAL SIGNS:  reviewed"}  ASSESSMENT & PLAN:    1. Syncope - 2 isolated episodes, both assoicated with being in high temperatures outdoors for extended period of time.  Somewhat poor oral hydration given high caffeinated drink intake, alcohol regularly during the week. Also he reprots some high blood sugars which also could play a role in volume status - stucturally normal heart by echo, baseline EKG shows NSR - no further workup at this time, if recurrent episode would plan for outpatient monitor. We discussed aggressive hydration.     COVID-19 Education: The signs and symptoms of COVID-19 were discussed with the patient and how to seek care for testing (follow up with PCP or arrange E-visit).  ***The importance of social distancing was discussed today.  Time:   Today, I have spent *** minutes with the patient with telehealth technology discussing the above problems.     Medication Adjustments/Labs and Tests Ordered: Current medicines are reviewed at length with the patient today.  Concerns regarding medicines are outlined above.   Tests Ordered: No orders of the defined types were placed in this encounter.   Medication Changes: No orders of the defined types were placed in this encounter.   Follow Up:  {F/U  [phone]} {follow up:15908}  Signed, Carlyle Dolly, MD  06/06/2020 7:55 AM    [facility]

## 2020-07-03 ENCOUNTER — Other Ambulatory Visit (INDEPENDENT_AMBULATORY_CARE_PROVIDER_SITE_OTHER): Payer: Self-pay | Admitting: Nurse Practitioner

## 2020-07-04 ENCOUNTER — Other Ambulatory Visit (INDEPENDENT_AMBULATORY_CARE_PROVIDER_SITE_OTHER): Payer: Self-pay | Admitting: Internal Medicine

## 2020-07-16 ENCOUNTER — Other Ambulatory Visit (INDEPENDENT_AMBULATORY_CARE_PROVIDER_SITE_OTHER): Payer: Self-pay | Admitting: Nurse Practitioner

## 2020-07-16 DIAGNOSIS — I10 Essential (primary) hypertension: Secondary | ICD-10-CM

## 2020-07-24 LAB — HM DIABETES EYE EXAM

## 2020-08-01 ENCOUNTER — Other Ambulatory Visit (INDEPENDENT_AMBULATORY_CARE_PROVIDER_SITE_OTHER): Payer: Self-pay | Admitting: Internal Medicine

## 2020-08-02 ENCOUNTER — Other Ambulatory Visit (INDEPENDENT_AMBULATORY_CARE_PROVIDER_SITE_OTHER): Payer: Self-pay | Admitting: Nurse Practitioner

## 2020-08-16 ENCOUNTER — Telehealth: Payer: 59 | Admitting: Cardiology

## 2020-08-16 NOTE — Progress Notes (Unsigned)
{Choose 1 Note Type (Video or Telephone):[id number]}    Date:  08/16/2020   ID:  [name], DOB [birth year], MRN [id number] The patient was identified using 2 identifiers.  {Patient [phone]::"Home"} {Provider Location:[phone]::"Home Office"}  PCP:  Doree Albee, MD  Cardiologist:  Carlyle Dolly, MD *** Electrophysiologist:  None   Evaluation Performed:  {Choose Visit KGYJ:[id number]::"OVZCHY-IF Visit"}  Chief Complaint:  ***  History of Present Illness:    Kunaal A Vittorio is a 50 y.o. male with ***  1. Syncope - 2 total episodes  11/2019 seen in [facility] ER with syncope - reportedly had been outside in high temperatures for several hours. - EKG at that time NSR rate 92 - had covid vaccine x 1   - was out working in the yard for 2-3 hours. Started feeling lightheaded, dizzy. No palpitations - had not ate much, did drink water that day - fell to the floor, seen by neihbor. Out very short period of time  - 2nd episode happened few weeks later - was having a cookout, was sitting at the table and felt very hot. Stood up.  - had been out in the heat with the grill   - not much water daily. Diet cokes 12 oz x 4-5, gatorarde 0 smaller bottles. Liquor whiskey 3 days a week neat x 5 shots  The patient {does/does not:200015} have symptoms concerning for COVID-19 infection (fever, chills, cough, or new shortness of breath).    Past Medical History:  Diagnosis Date  . Diabetes mellitus ([place])    Type 2 x 9 yrs.  . Hyperlipidemia   . Hypertension    x 5 yrs   Past Surgical History:  Procedure Laterality Date  .  rt knee arthroscopy    . BIOPSY  03/21/2020   Procedure: BIOPSY;  Surgeon: Harvel Quale, MD;  [facility];  Service: Gastroenterology;;  . COLONOSCOPY  04/10/2012   Procedure: COLONOSCOPY;  Surgeon: Rogene Houston, MD;  [facility];  Service: Endoscopy;  Laterality: N/A;  1200  . COLONOSCOPY WITH  PROPOFOL N/A 03/21/2020   Procedure: COLONOSCOPY WITH PROPOFOL;  Surgeon: Harvel Quale, MD;  [facility];  Service: Gastroenterology;  Laterality: N/A;  10:00  . POLYPECTOMY  03/21/2020   Procedure: POLYPECTOMY;  Surgeon: Montez Morita, Quillian Quince, MD;  [facility];  Service: Gastroenterology;;     No outpatient medications have been marked as taking for the 08/16/20 encounter (Appointment) with Arnoldo Lenis, MD.     Allergies:   Codeine   Social History   Tobacco Use  . Smoking status: Current Every Day Smoker  . Smokeless tobacco: Never Used  . Tobacco comment: Cigar a day  Vaping Use  . Vaping Use: Never used  Substance Use Topics  . Alcohol use: Yes    Comment: 6 pack a week  . Drug use: No     Family Hx: The patient's family history is not on file.  ROS:   Please see the history of present illness.    *** All other systems reviewed and are negative.   Prior CV studies:   The following studies were reviewed today:  01/2020 echo IMPRESSIONS    1. Left ventricular ejection fraction, by estimation, is 65 to 70%. The  left ventricle has normal function. The left ventricle has no regional  wall motion abnormalities. Left ventricular diastolic parameters were  normal.  2. Right ventricular systolic function is normal. The right  ventricular  size is normal. Tricuspid regurgitation signal is inadequate for assessing  PA pressure.  3. The mitral valve is grossly normal. Trivial mitral valve  regurgitation.  4. The aortic valve is tricuspid. Aortic valve regurgitation is not  visualized.  5. The inferior vena cava is normal in size with greater than 50%  respiratory variability, suggesting right atrial pressure of 3 mmHg.   01/2020 MRI brain IMPRESSION: Normal MRI brain.  Labs/Other Tests and Data Reviewed:    EKG:  {EKG/Telemetry Strips Reviewed:[phone]}  Recent Labs: 01/18/2020: Hemoglobin 15.7; Platelets 350;  TSH 0.81 02/17/2020: ALT 21; BUN 12; Creat 0.93; Potassium 4.7; Sodium 139   Recent Lipid Panel Lab Results  Component Value Date/Time   CHOL 199 06/21/2019 09:09 AM   TRIG 129 06/21/2019 09:09 AM   HDL 62 06/21/2019 09:09 AM   CHOLHDL 3.2 06/21/2019 09:09 AM   LDLCALC 113 (H) 06/21/2019 09:09 AM    Wt Readings from Last 3 Encounters:  04/05/20 146 lb 12.8 oz (66.6 kg)  03/09/20 148 lb (67.1 kg)  02/25/20 145 lb 6.4 oz (66 kg)     Risk Assessment/Calculations:   {Does this patient have ATRIAL FIBRILLATION?:[phone]}  Objective:    Vital Signs:  There were no vitals taken for this visit.   {[facility] Virtual Exam (Optional):[phone]::"VITAL SIGNS:  reviewed"}  ASSESSMENT & PLAN:    1. Syncope - 2 isolated episodes, both assoicated with being in high temperatures outdoors for extended period of time.  Somewhat poor oral hydration given high caffeinated drink intake, alcohol regularly during the week. Also he reprots some high blood sugars which also could play a role in volume status - stucturally normal heart by echo, baseline EKG shows NSR - no further workup at this time, if recurrent episode would plan for outpatient monitor. We discussed aggressive hydration.    {Are you ordering a CV Procedure (e.g. stress test, cath, DCCV, TEE, etc)?   Press F2        :[id number]}    COVID-19 Education: The signs and symptoms of COVID-19 were discussed with the patient and how to seek care for testing (follow up with PCP or arrange E-visit).  ***The importance of social distancing was discussed today.  Time:   Today, I have spent *** minutes with the patient with telehealth technology discussing the above problems.     Medication Adjustments/Labs and Tests Ordered: Current medicines are reviewed at length with the patient today.  Concerns regarding medicines are outlined above.   Tests Ordered: No orders of the defined types were placed in this encounter.   Medication  Changes: No orders of the defined types were placed in this encounter.   Follow Up:  {F/U Format:[phone]} {follow up:15908}  Signed, Carlyle Dolly, MD  08/16/2020 7:53 AM    [facility]

## 2020-09-03 ENCOUNTER — Other Ambulatory Visit (INDEPENDENT_AMBULATORY_CARE_PROVIDER_SITE_OTHER): Payer: Self-pay | Admitting: Internal Medicine

## 2020-09-21 ENCOUNTER — Ambulatory Visit (INDEPENDENT_AMBULATORY_CARE_PROVIDER_SITE_OTHER): Payer: 59 | Admitting: Internal Medicine

## 2020-09-21 ENCOUNTER — Other Ambulatory Visit: Payer: Self-pay

## 2020-09-21 ENCOUNTER — Encounter (INDEPENDENT_AMBULATORY_CARE_PROVIDER_SITE_OTHER): Payer: Self-pay | Admitting: Internal Medicine

## 2020-09-21 VITALS — BP 128/76 | HR 102 | Temp 97.8°F | Ht 65.0 in | Wt 148.4 lb

## 2020-09-21 DIAGNOSIS — R6882 Decreased libido: Secondary | ICD-10-CM

## 2020-09-21 DIAGNOSIS — I10 Essential (primary) hypertension: Secondary | ICD-10-CM

## 2020-09-21 DIAGNOSIS — E559 Vitamin D deficiency, unspecified: Secondary | ICD-10-CM

## 2020-09-21 DIAGNOSIS — R5383 Other fatigue: Secondary | ICD-10-CM

## 2020-09-21 DIAGNOSIS — Z1159 Encounter for screening for other viral diseases: Secondary | ICD-10-CM

## 2020-09-21 DIAGNOSIS — E118 Type 2 diabetes mellitus with unspecified complications: Secondary | ICD-10-CM | POA: Diagnosis not present

## 2020-09-21 DIAGNOSIS — N529 Male erectile dysfunction, unspecified: Secondary | ICD-10-CM | POA: Diagnosis not present

## 2020-09-21 DIAGNOSIS — R5381 Other malaise: Secondary | ICD-10-CM

## 2020-09-21 DIAGNOSIS — E1165 Type 2 diabetes mellitus with hyperglycemia: Secondary | ICD-10-CM

## 2020-09-21 DIAGNOSIS — Z794 Long term (current) use of insulin: Secondary | ICD-10-CM

## 2020-09-21 DIAGNOSIS — N401 Enlarged prostate with lower urinary tract symptoms: Secondary | ICD-10-CM

## 2020-09-21 DIAGNOSIS — R3912 Poor urinary stream: Secondary | ICD-10-CM

## 2020-09-21 DIAGNOSIS — IMO0002 Reserved for concepts with insufficient information to code with codable children: Secondary | ICD-10-CM

## 2020-09-21 NOTE — Progress Notes (Signed)
Metrics: Intervention Frequency ACO  Documented Smoking Status Yearly  Screened one or more times in 24 months  Cessation Counseling or  Active cessation medication Past 24 months  Past 24 months   Guideline developer: UpToDate (See UpToDate for funding source) Date Released: 2014       Wellness Office Visit  Subjective:  Patient ID: Justin [name], male    DOB: 1971/05/02  Age: 50 y.o. MRN: [id number]  CC: This man comes in for follow-up of uncontrolled diabetes, hypertension, vitamin D deficiency. HPI  He is now complaining of significant fatigue.  He also describes erectile dysfunction and decreased libido.  He also describes symptoms of BPH with poor urinary stream.  He says his father and grandfather both have a history of prostate cancer and he was concerned about this. In terms of his diabetes and diet, he has been better for the last 2 weeks.  He continues on a combination of insulin, Jardiance and Trulicity. Past Medical History:  Diagnosis Date   Diabetes mellitus ([place])    Type 2 x 9 yrs.   Hyperlipidemia    Hypertension    x 5 yrs   Past Surgical History:  Procedure Laterality Date    rt knee arthroscopy     BIOPSY  03/21/2020   Procedure: BIOPSY;  Surgeon: Harvel Quale, MD;  [facility];  Service: Gastroenterology;;   COLONOSCOPY  04/10/2012   Procedure: COLONOSCOPY;  Surgeon: Rogene Houston, MD;  [facility];  Service: Endoscopy;  Laterality: N/A;  1200   COLONOSCOPY WITH PROPOFOL N/A 03/21/2020   Procedure: COLONOSCOPY WITH PROPOFOL;  Surgeon: Harvel Quale, MD;  [facility];  Service: Gastroenterology;  Laterality: N/A;  10:00   POLYPECTOMY  03/21/2020   Procedure: POLYPECTOMY;  Surgeon: Harvel Quale, MD;  [facility];  Service: Gastroenterology;;     History reviewed. No pertinent family history.  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   Insulin Pen Needle (BD PEN NEEDLE NANO U/F) 32G X 4 MM MISC Use to check blood sugar daily   JARDIANCE 25 MG TABS tablet TAKE 25 MG BY MOUTH DAILY BEFORE BREAKFAST.   TRULICITY 1.5 IF/0.2DX SOPN INJECT 1.5 MG INTO THE SKIN ONCE A WEEK.     [place] Office Visit from 09/21/2020 in [facility]  PHQ-9 Total Score 0      Objective:   Today's Vitals: BP 128/76    Pulse (!) 102    Temp 97.8 F (36.6 C) (Temporal)    Ht 5\' 5"  (1.651 m)    Wt 148 lb 6.4 oz (67.3 kg)    SpO2 98%    BMI 24.70 kg/m  Vitals with BMI 09/21/2020 04/05/2020 03/21/2020  Height 5\' 5"  5\' 5"  -  Weight 148 lbs 6 oz 146 lbs 13  oz -  BMI 24.0 97.35 -  Systolic 329 924 268  Diastolic 76 80 77  Pulse 341 108 91     Physical Exam   He looks systemically well.  Blood pressure is in good control.  He has gained a couple of pounds since the last time I saw him in September last year.    Assessment   1. Uncontrolled type 2 diabetes mellitus with complication, with long-term current use of insulin ([place])   2. Essential hypertension, benign   3. Decreased libido   4. Erectile dysfunction, unspecified erectile dysfunction type   5. Malaise and fatigue   6. Benign prostatic hyperplasia with weak urinary stream   7. Vitamin D deficiency   8. Encounter for hepatitis C screening test for low risk patient       Tests ordered Orders Placed This Encounter  Procedures   CBC   COMPLETE METABOLIC  PANEL WITH GFR   Hemoglobin A1c   Lipid panel   T3, free   T4, free   TSH   Testosterone Total,Free,Bio, Males   PSA, Total with Reflex to PSA, Free   VITAMIN D 25 Hydroxy (Vit-D Deficiency, Fractures)   Hepatitis C antibody     Plan: 1. He will continue with amlodipine for his hypertension. 2. He will continue with Jardiance, insulin and Trulicity for his diabetes for the time being. 3. We will check blood work for testosterone, PSA and other blood work above. 4. Further recommendations I will discuss with him when I see him in about a month's time or earlier should the need arise.   No orders of the defined types were placed in this encounter.   Doree Albee, MD

## 2020-09-22 LAB — TESTOSTERONE TOTAL,FREE,BIO, MALES
Albumin: 4.7 g/dL (ref 3.6–5.1)
Sex Hormone Binding: 23 nmol/L (ref 10–50)
Testosterone, Bioavailable: 109.8 ng/dL — ABNORMAL LOW (ref >=110.0)
Testosterone, Free: 51.2 pg/mL (ref 46.0–224.0)
Testosterone: 310 ng/dL (ref 250–827)

## 2020-09-22 LAB — CBC
HCT: 47.3 % (ref 38.5–50.0)
Hemoglobin: 15.9 g/dL (ref 13.2–17.1)
MCH: 30.5 pg (ref 27.0–33.0)
MCHC: 33.6 g/dL (ref 32.0–36.0)
MCV: 90.6 fL (ref 80.0–100.0)
MPV: 10.2 fL (ref 7.5–12.5)
Platelets: 348 10*3/uL (ref 140–400)
RBC: 5.22 10*6/uL (ref 4.20–5.80)
RDW: 12.5 % (ref 11.0–15.0)
WBC: 7.1 10*3/uL (ref 3.8–10.8)

## 2020-09-22 LAB — COMPLETE METABOLIC PANEL WITH GFR
AG Ratio: 1.7 (calc) (ref 1.0–2.5)
ALT: 26 U/L (ref 9–46)
AST: 23 U/L (ref 10–40)
Albumin: 4.7 g/dL (ref 3.6–5.1)
Alkaline phosphatase (APISO): 78 U/L (ref 36–130)
BUN: 13 mg/dL (ref 7–25)
CO2: 28 mmol/L (ref 20–32)
Calcium: 10.1 mg/dL (ref 8.6–10.3)
Chloride: 105 mmol/L (ref 98–110)
Creat: 0.83 mg/dL (ref 0.60–1.35)
GFR, Est African American: 120 mL/min/{1.73_m2} (ref >=60)
GFR, Est Non African American: 103 mL/min/{1.73_m2} (ref >=60)
Globulin: 2.7 g/dL (calc) (ref 1.9–3.7)
Glucose, Bld: 167 mg/dL — ABNORMAL HIGH (ref 65–139)
Potassium: 4.4 mmol/L (ref 3.5–5.3)
Sodium: 140 mmol/L (ref 135–146)
Total Bilirubin: 0.3 mg/dL (ref 0.2–1.2)
Total Protein: 7.4 g/dL (ref 6.1–8.1)

## 2020-09-22 LAB — PSA, TOTAL WITH REFLEX TO PSA, FREE: PSA, Total: 0.7 ng/mL (ref <=4.0)

## 2020-09-22 LAB — LIPID PANEL
Cholesterol: 218 mg/dL — ABNORMAL HIGH (ref <200)
HDL: 59 mg/dL (ref >=40)
LDL Cholesterol (Calc): 132 mg/dL (calc) — ABNORMAL HIGH
Non-HDL Cholesterol (Calc): 159 mg/dL (calc) — ABNORMAL HIGH (ref <130)
Total CHOL/HDL Ratio: 3.7 (calc) (ref <5.0)
Triglycerides: 159 mg/dL — ABNORMAL HIGH (ref <150)

## 2020-09-22 LAB — HEPATITIS C ANTIBODY
Hepatitis C Ab: NONREACTIVE
SIGNAL TO CUT-OFF: 0.01 (ref <1.00)

## 2020-09-22 LAB — HEMOGLOBIN A1C
Hgb A1c MFr Bld: 10.4 % of total Hgb — ABNORMAL HIGH (ref <5.7)
Mean Plasma Glucose: 252 mg/dL
eAG (mmol/L): 13.9 mmol/L

## 2020-09-22 LAB — VITAMIN D 25 HYDROXY (VIT D DEFICIENCY, FRACTURES): Vit D, 25-Hydroxy: 23 ng/mL — ABNORMAL LOW (ref 30–100)

## 2020-09-22 LAB — T3, FREE: T3, Free: 2.8 pg/mL (ref 2.3–4.2)

## 2020-09-22 LAB — T4, FREE: Free T4: 1.2 ng/dL (ref 0.8–1.8)

## 2020-09-22 LAB — TSH: TSH: 1 mIU/L (ref 0.40–4.50)

## 2020-10-17 ENCOUNTER — Other Ambulatory Visit: Payer: Self-pay

## 2020-10-17 ENCOUNTER — Encounter (INDEPENDENT_AMBULATORY_CARE_PROVIDER_SITE_OTHER): Payer: Self-pay | Admitting: Internal Medicine

## 2020-10-17 ENCOUNTER — Ambulatory Visit (INDEPENDENT_AMBULATORY_CARE_PROVIDER_SITE_OTHER): Payer: 59 | Admitting: Internal Medicine

## 2020-10-17 VITALS — BP 136/77 | HR 78 | Temp 97.8°F | Resp 18 | Ht 65.0 in | Wt 147.0 lb

## 2020-10-17 DIAGNOSIS — R5381 Other malaise: Secondary | ICD-10-CM

## 2020-10-17 DIAGNOSIS — N529 Male erectile dysfunction, unspecified: Secondary | ICD-10-CM

## 2020-10-17 DIAGNOSIS — E118 Type 2 diabetes mellitus with unspecified complications: Secondary | ICD-10-CM

## 2020-10-17 DIAGNOSIS — R6882 Decreased libido: Secondary | ICD-10-CM

## 2020-10-17 DIAGNOSIS — I10 Essential (primary) hypertension: Secondary | ICD-10-CM | POA: Diagnosis not present

## 2020-10-17 DIAGNOSIS — Z794 Long term (current) use of insulin: Secondary | ICD-10-CM

## 2020-10-17 DIAGNOSIS — E559 Vitamin D deficiency, unspecified: Secondary | ICD-10-CM

## 2020-10-17 DIAGNOSIS — IMO0002 Reserved for concepts with insufficient information to code with codable children: Secondary | ICD-10-CM

## 2020-10-17 DIAGNOSIS — E1165 Type 2 diabetes mellitus with hyperglycemia: Secondary | ICD-10-CM

## 2020-10-17 DIAGNOSIS — R5383 Other fatigue: Secondary | ICD-10-CM

## 2020-10-17 MED ORDER — NP THYROID 60 MG PO TABS: 60.0000 mg | ORAL_TABLET | Freq: Every day | ORAL | 3 refills | Status: DC

## 2020-10-17 MED ORDER — TRULICITY 3 MG/0.5ML ~~LOC~~ SOAJ: 3.0000 mg | SUBCUTANEOUS | 3 refills | Status: DC

## 2020-10-17 MED ORDER — TESTOSTERONE 20 % CREA: 100.0000 mg | TOPICAL_CREAM | Freq: Every day | 0 refills | Status: DC

## 2020-10-17 NOTE — Progress Notes (Signed)
Metrics: Intervention Frequency ACO  Documented Smoking Status Yearly  Screened one or more times in 24 months  Cessation Counseling or  Active cessation medication Past 24 months  Past 24 months   Guideline developer: UpToDate (See UpToDate for funding source) Date Released: 2014       Wellness Office Visit  Subjective:  Patient ID: Justin [name], male    DOB: [birth year]  Age: 50 y.o. MRN: [id number]  CC: This man comes in to discuss his most recent lab work and further recommendations. HPI  His diabetes remains uncontrolled with hemoglobin A1c over 10%. He has vitamin D deficiency. He has suboptimal testosterone levels. He has suboptimal free T3 levels. He really wants to improve his diabetes and he has begun to improve his diet since the last time we met. He continues to complain of fatigue, decreased libido and erectile dysfunction. He clearly has insulin resistance. Past Medical History:  Diagnosis Date  . Diabetes mellitus ([place])    Type 2 x 9 yrs.  . Hyperlipidemia   . Hypertension    x 5 yrs   Past Surgical History:  Procedure Laterality Date  .  rt knee arthroscopy    . BIOPSY  03/21/2020   Procedure: BIOPSY;  Surgeon: Harvel Quale, MD;  [facility];  Service: Gastroenterology;;  . COLONOSCOPY  04/10/2012   Procedure: COLONOSCOPY;  Surgeon: Rogene Houston, MD;  [facility];  Service: Endoscopy;  Laterality: N/A;  1200  . COLONOSCOPY WITH PROPOFOL N/A 03/21/2020   Procedure: COLONOSCOPY WITH PROPOFOL;  Surgeon: Harvel Quale, MD;  [facility];  Service: Gastroenterology;  Laterality: N/A;  10:00  . POLYPECTOMY  03/21/2020   Procedure: POLYPECTOMY;  Surgeon: Harvel Quale, MD;  [facility];  Service: Gastroenterology;;     History reviewed. No pertinent family history.  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 Smoking status: Current Every Day Smoker  . Smokeless tobacco: Never Used  . Tobacco comment: Cigar a day  Substance Use Topics  . Alcohol use: Yes    Comment: 6 pack a week    Current Meds  Medication Sig  . amLODipine (NORVASC) 5 MG tablet TAKE 1 TABLET BY MOUTH EVERY DAY  . benazepril (LOTENSIN) 40 MG tablet TAKE 1 TABLET BY MOUTH EVERY DAY  . insulin detemir (LEVEMIR) 100 UNIT/ML FlexPen Inject 20 Units into the skin daily.  . Insulin Pen Needle (BD PEN NEEDLE NANO U/F) 32G X 4 MM MISC Use to check blood sugar daily  . JARDIANCE 25 MG TABS tablet TAKE 25 MG BY MOUTH DAILY BEFORE BREAKFAST.  . NP THYROID 60 MG tablet Take 1 tablet (60 mg total) by mouth daily before breakfast.  . Testosterone 20 % CREA Apply 100 mg topically daily at 12 noon.  . [DISCONTINUED] Dulaglutide (TRULICITY) 3 UU/7.2ZD SOPN Inject 0.5 mLs (3 mg total) as directed once a week.  . [DISCONTINUED] TRULICITY 1.5 GU/4.4IH SOPN INJECT 1.5 MG INTO THE SKIN ONCE A WEEK.     [place] Office Visit from 09/21/2020 in [facility]  PHQ-9 Total Score 0      Objective:   Today's Vitals: BP 136/77 (BP Location: Right Arm, Patient Position: Sitting, Cuff Size: Normal)   Pulse 78   Temp 97.8 F (36.6 C) (Temporal)   Resp 18  Ht _0  (1.651 m)   Wt 147 lb (66.7 kg)   SpO2 98%   BMI 24.46 kg/m  Vitals with BMI 10/17/2020 09/21/2020 04/05/2020  Height _1  _2  _3   Weight 147 lbs 148 lbs 6 oz 146 lbs 13 oz  BMI 24.46 76.3 94.32  Systolic 003 794 446  Diastolic 77 76 80  Pulse 78 102 108     Physical Exam  He has lost just a pound since last visit.  Blood pressure is acceptable today.     Assessment   1. Uncontrolled type 2 diabetes mellitus with complication, with long-term current use of insulin ([place])   2. Decreased libido   3. Essential hypertension, benign   4. Erectile dysfunction, unspecified erectile dysfunction type   5. Malaise and  fatigue   6. Vitamin D deficiency       Tests ordered No orders of the defined types were placed in this encounter.    Plan: 1. I am going to increase his Trulicity dose to 3 mg once a week. 2. As far as his testosterone is concerned, we discussed testosterone therapy and he would like to go with testosterone cream.  I discussed benefits and side effects and mode of administration.  He will start with testosterone cream 20%, 100 mg applied to the scrotal skin daily. 3. He does appear to have symptoms of thyroid hypofunction and he has a suboptimal free T3 level.  After shared decision making, he agreed that we should start on desiccated NP thyroid 60 mg daily.  I explained possible side effects.  This will hopefully improve his insulin resistance also and thereby improve his diabetes. 4. He needs to definitely start taking vitamin D3 10,000 units daily as his vitamin D levels are extremely low. 5. I will see him in about 6 weeks for follow-up and see how he is doing then.   Meds ordered this encounter  Medications  . NP THYROID 60 MG tablet    Sig: Take 1 tablet (60 mg total) by mouth daily before breakfast.    Dispense:  30 tablet    Refill:  3  . Dulaglutide (TRULICITY) 3 FJ/0.1QQ SOPN    Sig: Inject 3 mg as directed once a week.    Dispense:  2 mL    Refill:  3  . Testosterone 20 % CREA    Sig: Apply 100 mg topically daily at 12 noon.    Dispense:  100 g    Refill:  0    Nimish Luther Parody, MD

## 2020-11-30 ENCOUNTER — Ambulatory Visit (INDEPENDENT_AMBULATORY_CARE_PROVIDER_SITE_OTHER): Payer: 59 | Admitting: Internal Medicine

## 2020-11-30 ENCOUNTER — Other Ambulatory Visit: Payer: Self-pay

## 2020-11-30 ENCOUNTER — Encounter (INDEPENDENT_AMBULATORY_CARE_PROVIDER_SITE_OTHER): Payer: Self-pay | Admitting: Internal Medicine

## 2020-11-30 VITALS — BP 130/80 | HR 70 | Temp 97.7°F | Resp 18 | Ht 65.0 in | Wt 149.3 lb

## 2020-11-30 DIAGNOSIS — R5383 Other fatigue: Secondary | ICD-10-CM

## 2020-11-30 DIAGNOSIS — R5381 Other malaise: Secondary | ICD-10-CM

## 2020-11-30 DIAGNOSIS — N529 Male erectile dysfunction, unspecified: Secondary | ICD-10-CM

## 2020-11-30 NOTE — Progress Notes (Signed)
Metrics: Intervention Frequency ACO  Documented Smoking Status Yearly  Screened one or more times in 24 months  Cessation Counseling or  Active cessation medication Past 24 months  Past 24 months   Guideline developer: UpToDate (See UpToDate for funding source) Date Released: 2014       Wellness Office Visit  Subjective:  Patient ID: Justin [name], male    DOB: 25-Jun-1971  Age: 50 y.o. MRN: [id number]  CC: This man comes in for follow-up of his testosterone and thyroid therapy which we started on the last visit. HPI  He has tolerated NP thyroid 60 mg daily and testosterone therapy.  He feels that his energy level has improved significantly but surprisingly his erectile dysfunction has gotten worse.  This is somewhat surprising. He is sticking to more of a diet consistently now. Past Medical History:  Diagnosis Date  . Diabetes mellitus ([place])    Type 2 x 9 yrs.  . Hyperlipidemia   . Hypertension    x 5 yrs   Past Surgical History:  Procedure Laterality Date  .  rt knee arthroscopy    . BIOPSY  03/21/2020   Procedure: BIOPSY;  Surgeon: Harvel Quale, MD;  [facility];  Service: Gastroenterology;;  . COLONOSCOPY  04/10/2012   Procedure: COLONOSCOPY;  Surgeon: Rogene Houston, MD;  [facility];  Service: Endoscopy;  Laterality: N/A;  1200  . COLONOSCOPY WITH PROPOFOL N/A 03/21/2020   Procedure: COLONOSCOPY WITH PROPOFOL;  Surgeon: Harvel Quale, MD;  [facility];  Service: Gastroenterology;  Laterality: N/A;  10:00  . POLYPECTOMY  03/21/2020   Procedure: POLYPECTOMY;  Surgeon: Harvel Quale, MD;  [facility];  Service: Gastroenterology;;     History reviewed. No pertinent family history.  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 Smoking status: Current Every Day Smoker  . Smokeless tobacco:  Never Used  . Tobacco comment: Cigar a day  Substance Use Topics  . Alcohol use: Yes    Comment: 6 pack a week    Current Meds  Medication Sig  . amLODipine (NORVASC) 5 MG tablet TAKE 1 TABLET BY MOUTH EVERY DAY  . benazepril (LOTENSIN) 40 MG tablet TAKE 1 TABLET BY MOUTH EVERY DAY  . Dulaglutide (TRULICITY) 3 GQ/6.7YP SOPN Inject 3 mg as directed once a week.  . insulin detemir (LEVEMIR) 100 UNIT/ML FlexPen Inject 20 Units into the skin daily.  . Insulin Pen Needle (BD PEN NEEDLE NANO U/F) 32G X 4 MM MISC Use to check blood sugar daily  . JARDIANCE 25 MG TABS tablet TAKE 25 MG BY MOUTH DAILY BEFORE BREAKFAST.  . NP THYROID 60 MG tablet Take 1 tablet (60 mg total) by mouth daily before breakfast.  . Testosterone 20 % CREA Apply 100 mg topically daily at 12 noon.     [place] Office Visit from 09/21/2020 in [facility]  PHQ-9 Total Score 0      Objective:   Today's Vitals: BP 130/80 (BP Location: Right Arm, Patient Position: Sitting, Cuff Size: Normal)   Pulse 70   Temp 97.7 F (36.5 C) (Temporal)   Resp 18   Ht 5\' 5"  (1.651 m)   Wt 149 lb 4.8 oz (67.7 kg)   SpO2 99%   BMI 24.84 kg/m  Vitals with BMI 11/30/2020 10/17/2020 09/21/2020  Height 5\' 5"  5\' 5"  5\' 5"   Weight 149 lbs 5 oz 147 lbs 148 lbs 6 oz  BMI 24.84 91.47 82.9  Systolic 562 130 865  Diastolic 80 77 76  Pulse 70 78 102     Physical Exam  He looks systemically well.  His weight is stable.  Blood pressure acceptable.     Assessment   1. Erectile dysfunction, unspecified erectile dysfunction type   2. Malaise and fatigue       Tests ordered Orders Placed This Encounter  Procedures  . T3, free  . TSH  . Testosterone Total,Free,Bio, Males  . DHEA-sulfate     Plan: 1. He will continue with testosterone dose of 100 mg daily but we may need to increase it further depending on blood work results. 2. Continue with NP thyroid 60 mg daily but I think most likely will optimize further  but we will see what the results show. 3. I will also check his DHEA levels to see if there is room to maneuver regarding this hormone. 4. Follow-up in about 1 month.   No orders of the defined types were placed in this encounter.   Doree Albee, MD

## 2020-12-01 LAB — TESTOSTERONE TOTAL,FREE,BIO, MALES
Albumin: 4.7 g/dL (ref 3.6–5.1)
Sex Hormone Binding: 28 nmol/L (ref 10–50)
Testosterone: 127 ng/dL — ABNORMAL LOW (ref 250–827)

## 2020-12-01 LAB — TSH: TSH: 0.42 mIU/L (ref 0.40–4.50)

## 2020-12-01 LAB — T3, FREE: T3, Free: 3.4 pg/mL (ref 2.3–4.2)

## 2020-12-01 LAB — DHEA-SULFATE: DHEA-SO4: 95 ug/dL (ref 61–442)

## 2020-12-04 ENCOUNTER — Other Ambulatory Visit (INDEPENDENT_AMBULATORY_CARE_PROVIDER_SITE_OTHER): Payer: Self-pay | Admitting: Internal Medicine

## 2020-12-04 MED ORDER — NP THYROID 90 MG PO TABS: 90.0000 mg | ORAL_TABLET | Freq: Every day | ORAL | 3 refills | Status: DC

## 2020-12-11 NOTE — Progress Notes (Signed)
Called patient today. Pt has seen the message. Reviewed instructions again with him. Pt stated he has STOP the Testosterone cream ; since he heard Dr Darnell Level say his levels was not low not better. So I explained  to not to stop medications, that he has to fine tune the dosages to get him where he needs to be. So to resume cream again . To now take 2 clicks of Testosterone cream in the AM and 2 clicks in the PM. Also to pick up NP thyroid 90 mg at pharmacy and it will be ready to start also.

## 2020-12-27 ENCOUNTER — Other Ambulatory Visit (INDEPENDENT_AMBULATORY_CARE_PROVIDER_SITE_OTHER): Payer: Self-pay | Admitting: Internal Medicine

## 2021-01-03 ENCOUNTER — Encounter (INDEPENDENT_AMBULATORY_CARE_PROVIDER_SITE_OTHER): Payer: Self-pay | Admitting: Internal Medicine

## 2021-01-03 ENCOUNTER — Ambulatory Visit (INDEPENDENT_AMBULATORY_CARE_PROVIDER_SITE_OTHER): Payer: 59 | Admitting: Internal Medicine

## 2021-01-03 ENCOUNTER — Other Ambulatory Visit: Payer: Self-pay

## 2021-01-03 VITALS — BP 144/90 | HR 106 | Temp 97.1°F | Ht 65.0 in | Wt 144.6 lb

## 2021-01-03 DIAGNOSIS — E118 Type 2 diabetes mellitus with unspecified complications: Secondary | ICD-10-CM | POA: Diagnosis not present

## 2021-01-03 DIAGNOSIS — E1165 Type 2 diabetes mellitus with hyperglycemia: Secondary | ICD-10-CM

## 2021-01-03 DIAGNOSIS — IMO0002 Reserved for concepts with insufficient information to code with codable children: Secondary | ICD-10-CM

## 2021-01-03 DIAGNOSIS — I10 Essential (primary) hypertension: Secondary | ICD-10-CM | POA: Diagnosis not present

## 2021-01-03 DIAGNOSIS — E7849 Other hyperlipidemia: Secondary | ICD-10-CM

## 2021-01-03 DIAGNOSIS — Z794 Long term (current) use of insulin: Secondary | ICD-10-CM

## 2021-01-03 DIAGNOSIS — N529 Male erectile dysfunction, unspecified: Secondary | ICD-10-CM

## 2021-01-03 MED ORDER — TESTOSTERONE CYPIONATE 200 MG/ML IM SOLN: 100.0000 mg | INTRAMUSCULAR | 0 refills | Status: DC

## 2021-01-03 MED ORDER — AMLODIPINE BESYLATE 5 MG PO TABS: 1.0000 | ORAL_TABLET | Freq: Every day | ORAL | 1 refills | Status: DC

## 2021-01-03 NOTE — Progress Notes (Signed)
Metrics: Intervention Frequency ACO  Documented Smoking Status Yearly  Screened one or more times in 24 months  Cessation Counseling or  Active cessation medication Past 24 months  Past 24 months   Guideline developer: UpToDate (See UpToDate for funding source) Date Released: 2014       Wellness Office Visit  Subjective:  Patient ID: Justin [name], male    DOB: [birth year]  Age: 50 y.o. MRN: [id number]  CC: This man comes in for follow-up of diabetes, hypertension, testosterone therapy. HPI  Unfortunately, he has not taken testosterone cream for the last 2 weeks.  He did not feel it helped him and it was inconvenient to apply the cream twice a day. He also has run out of his amlodipine so he has not taken this for the last several days. His diet has become more consistent thankfully. He continues on Trulicity but he needs a refill on this. Past Medical History:  Diagnosis Date  . Diabetes mellitus ([place])    Type 2 x 9 yrs.  . Hyperlipidemia   . Hypertension    x 5 yrs   Past Surgical History:  Procedure Laterality Date  .  rt knee arthroscopy    . BIOPSY  03/21/2020   Procedure: BIOPSY;  Surgeon: Harvel Quale, MD;  [facility];  Service: Gastroenterology;;  . COLONOSCOPY  04/10/2012   Procedure: COLONOSCOPY;  Surgeon: Rogene Houston, MD;  [facility];  Service: Endoscopy;  Laterality: N/A;  1200  . COLONOSCOPY WITH PROPOFOL N/A 03/21/2020   Procedure: COLONOSCOPY WITH PROPOFOL;  Surgeon: Harvel Quale, MD;  [facility];  Service: Gastroenterology;  Laterality: N/A;  10:00  . POLYPECTOMY  03/21/2020   Procedure: POLYPECTOMY;  Surgeon: Harvel Quale, MD;  [facility];  Service: Gastroenterology;;     History reviewed. No pertinent family history.  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  Smoking status: Current Every Day Smoker  . Smokeless tobacco: Never Used  . Tobacco comment: Cigar a day  Substance Use Topics  . Alcohol use: Yes    Comment: 6 pack a week    Current Meds  Medication Sig  . benazepril (LOTENSIN) 40 MG tablet TAKE 1 TABLET BY MOUTH EVERY DAY  . Dulaglutide (TRULICITY) 3 QQ/7.6PP SOPN Inject 3 mg as directed once a week.  . insulin detemir (LEVEMIR) 100 UNIT/ML FlexPen Inject 20 Units into the skin daily.  . Insulin Pen Needle (BD PEN NEEDLE NANO U/F) 32G X 4 MM MISC Use to check blood sugar daily  . JARDIANCE 25 MG TABS tablet TAKE 25 MG BY MOUTH DAILY BEFORE BREAKFAST.  . NP THYROID 90 MG tablet Take 1 tablet (90 mg total) by mouth daily.  . Testosterone 20 % CREA Apply 100 mg topically daily at 12 noon.  [name] Kitchen testosterone cypionate (DEPO-TESTOSTERONE) 200 MG/ML injection Inject 0.5 mLs (100 mg total) into the muscle every 7 (seven) days.  . [DISCONTINUED] amLODipine (NORVASC) 5 MG tablet TAKE 1 TABLET BY MOUTH [place] Office Visit from 01/03/2021 in [facility]  PHQ-9 Total Score 0      Objective:   Today's Vitals: BP (!) 144/90   Pulse (!) 106   Temp (!) 97.1 F (36.2 C) (Temporal)   Ht 5\' 5"  (1.651 m)  Wt 144 lb 9.6 oz (65.6 kg)   SpO2 97%   BMI 24.06 kg/m  Vitals with BMI 01/03/2021 11/30/2020 10/17/2020  Height 5\' 5"  5\' 5"  5\' 5"   Weight 144 lbs 10 oz 149 lbs 5 oz 147 lbs  BMI 24.06 23.76 28.31  Systolic 517 616 073  Diastolic 90 80 77  Pulse 710 70 78     Physical Exam  He has lost 5 pounds in weight.  Blood pressure elevated but he has not taken amlodipine for the last several days.     Assessment   1. Erectile dysfunction, unspecified erectile dysfunction type   2. Uncontrolled type 2 diabetes mellitus with complication, with long-term current use of insulin ([place])   3. Essential hypertension, benign   4. Other hyperlipidemia       Tests ordered Orders Placed This Encounter  Procedures   . COMPLETE METABOLIC PANEL WITH GFR  . Hemoglobin A1c  . TSH  . T3, free     Plan: 1. We discussed testosterone therapy again and is willing to try testosterone injections which are typically once a week.  I have sent a new prescription to his pharmacy regarding this.  He will come in and receive his first injection and get education on administration. 2. Continue with NP thyroid 90 mg daily and we will check thyroid function test today. 3. Continue with Trulicity and Jardiance for his diabetes as well as insulin and we will check an A1c. 4. Follow-up in about 6 weeks.   Meds ordered this encounter  Medications  . amLODipine (NORVASC) 5 MG tablet    Sig: Take 1 tablet (5 mg total) by mouth daily.    Dispense:  90 tablet    Refill:  1  . testosterone cypionate (DEPO-TESTOSTERONE) 200 MG/ML injection    Sig: Inject 0.5 mLs (100 mg total) into the muscle every 7 (seven) days.    Dispense:  10 mL    Refill:  0    Kairos Panetta Luther Parody, MD

## 2021-01-04 ENCOUNTER — Other Ambulatory Visit (INDEPENDENT_AMBULATORY_CARE_PROVIDER_SITE_OTHER): Payer: Self-pay | Admitting: Internal Medicine

## 2021-01-04 LAB — COMPLETE METABOLIC PANEL WITH GFR
AG Ratio: 1.9 (calc) (ref 1.0–2.5)
ALT: 38 U/L (ref 9–46)
AST: 32 U/L (ref 10–35)
Albumin: 4.9 g/dL (ref 3.6–5.1)
Alkaline phosphatase (APISO): 94 U/L (ref 35–144)
BUN: 12 mg/dL (ref 7–25)
CO2: 26 mmol/L (ref 20–32)
Calcium: 10.5 mg/dL — ABNORMAL HIGH (ref 8.6–10.3)
Chloride: 101 mmol/L (ref 98–110)
Creat: 0.97 mg/dL (ref 0.70–1.33)
GFR, Est African American: 105 mL/min/{1.73_m2} (ref >=60)
GFR, Est Non African American: 91 mL/min/{1.73_m2} (ref >=60)
Globulin: 2.6 g/dL (calc) (ref 1.9–3.7)
Glucose, Bld: 266 mg/dL — ABNORMAL HIGH (ref 65–139)
Potassium: 4.9 mmol/L (ref 3.5–5.3)
Sodium: 138 mmol/L (ref 135–146)
Total Bilirubin: 0.3 mg/dL (ref 0.2–1.2)
Total Protein: 7.5 g/dL (ref 6.1–8.1)

## 2021-01-04 LAB — HEMOGLOBIN A1C
Hgb A1c MFr Bld: 9.7 % of total Hgb — ABNORMAL HIGH (ref <5.7)
Mean Plasma Glucose: 232 mg/dL
eAG (mmol/L): 12.8 mmol/L

## 2021-01-04 LAB — TSH: TSH: 0.34 mIU/L — ABNORMAL LOW (ref 0.40–4.50)

## 2021-01-04 LAB — T3, FREE: T3, Free: 4 pg/mL (ref 2.3–4.2)

## 2021-01-04 MED ORDER — NP THYROID 120 MG PO TABS
120.0000 mg | ORAL_TABLET | Freq: Every day | ORAL | 3 refills | Status: DC
Start: 2021-01-04 — End: 2021-03-27

## 2021-01-29 ENCOUNTER — Other Ambulatory Visit (INDEPENDENT_AMBULATORY_CARE_PROVIDER_SITE_OTHER): Payer: Self-pay | Admitting: Internal Medicine

## 2021-01-31 ENCOUNTER — Other Ambulatory Visit (INDEPENDENT_AMBULATORY_CARE_PROVIDER_SITE_OTHER): Payer: Self-pay | Admitting: Nurse Practitioner

## 2021-01-31 DIAGNOSIS — IMO0002 Reserved for concepts with insufficient information to code with codable children: Secondary | ICD-10-CM

## 2021-02-02 ENCOUNTER — Telehealth (INDEPENDENT_AMBULATORY_CARE_PROVIDER_SITE_OTHER): Payer: Self-pay

## 2021-02-19 ENCOUNTER — Ambulatory Visit (INDEPENDENT_AMBULATORY_CARE_PROVIDER_SITE_OTHER): Payer: 59 | Admitting: Internal Medicine

## 2021-03-27 ENCOUNTER — Ambulatory Visit: Payer: 59 | Admitting: Family Medicine

## 2021-03-27 ENCOUNTER — Ambulatory Visit: Payer: Self-pay | Admitting: Family Medicine

## 2021-03-27 ENCOUNTER — Other Ambulatory Visit: Payer: Self-pay

## 2021-03-27 ENCOUNTER — Encounter: Payer: Self-pay | Admitting: Family Medicine

## 2021-03-27 VITALS — BP 129/90 | HR 106 | Ht 65.0 in | Wt 146.0 lb

## 2021-03-27 DIAGNOSIS — E118 Type 2 diabetes mellitus with unspecified complications: Secondary | ICD-10-CM

## 2021-03-27 DIAGNOSIS — E559 Vitamin D deficiency, unspecified: Secondary | ICD-10-CM

## 2021-03-27 DIAGNOSIS — I152 Hypertension secondary to endocrine disorders: Secondary | ICD-10-CM

## 2021-03-27 DIAGNOSIS — E1165 Type 2 diabetes mellitus with hyperglycemia: Secondary | ICD-10-CM

## 2021-03-27 DIAGNOSIS — Z7689 Persons encountering health services in other specified circumstances: Secondary | ICD-10-CM

## 2021-03-27 DIAGNOSIS — Z794 Long term (current) use of insulin: Secondary | ICD-10-CM

## 2021-03-27 DIAGNOSIS — IMO0002 Reserved for concepts with insufficient information to code with codable children: Secondary | ICD-10-CM

## 2021-03-27 DIAGNOSIS — E1169 Type 2 diabetes mellitus with other specified complication: Secondary | ICD-10-CM | POA: Diagnosis not present

## 2021-03-27 DIAGNOSIS — E785 Hyperlipidemia, unspecified: Secondary | ICD-10-CM

## 2021-03-27 DIAGNOSIS — E1159 Type 2 diabetes mellitus with other circulatory complications: Secondary | ICD-10-CM

## 2021-03-27 DIAGNOSIS — E039 Hypothyroidism, unspecified: Secondary | ICD-10-CM | POA: Diagnosis not present

## 2021-03-27 MED ORDER — TRULICITY 3 MG/0.5ML ~~LOC~~ SOAJ: 3.0000 mg | SUBCUTANEOUS | 3 refills | Status: DC

## 2021-03-27 MED ORDER — BENAZEPRIL HCL 40 MG PO TABS: 40.0000 mg | ORAL_TABLET | Freq: Every day | ORAL | 1 refills | Status: DC

## 2021-03-27 MED ORDER — NP THYROID 120 MG PO TABS: 120.0000 mg | ORAL_TABLET | Freq: Every day | ORAL | 3 refills | Status: DC

## 2021-03-27 NOTE — Patient Instructions (Signed)

## 2021-03-27 NOTE — Progress Notes (Signed)
Subjective:  Patient ID: Justin [name], male    DOB: Dec 12, 1970, 50 y.o.   MRN: [id number]  Patient Care Team: Doree Albee, MD (Inactive) as PCP - General (Internal Medicine) Harl Bowie Alphonse Guild, MD as PCP - Cardiology (Cardiology)   Chief Complaint:  Medical Management of Chronic Issues, New Patient (Initial Visit), Diabetes, and Hypertension   HPI: Justin [name] is a 50 y.o. male presenting on 03/27/2021 for Medical Management of Chronic Issues, New Patient (Initial Visit), Diabetes, and Hypertension   Pt presents today to establish care with new PCP as his recently passed away. He last saw his PCP 12/2020, EHR reviewed along with lab results. A1C 9.7, Vit D 23, glucose 266, TSH 0.34, testosterone 127, TC 218, TG 159, LDL 132, HDL 59, Ca 10.5. He reports he does not follow a good diet but is active on a regular basis. He was on testosterone repletion therapy but felt it made erectile dysfunction worse so he stopped taking it. He is not on statin therapy and does not wish to start. He states blood sugar is always elevated and BP runs around 140/90 most of the time. He denies chest pain, headaches, visual changes, leg swelling, dyspnea, hypo- or hyperglycemic symptoms. No hypo- or hyperthyroid symptoms reported. Colonoscopy is up to date, eye exam is up to date. He is on Vit D repletion therapy and denies arthralgias.  He does need refills on a few of his medications today.     Relevant past medical, surgical, family, and social history reviewed and updated as indicated.  Allergies and medications reviewed and updated. Data reviewed: Chart in Epic.   Past Medical History:  Diagnosis Date   Diabetes mellitus ([place])    Type 2 x 9 yrs.   Hyperlipidemia    Hypertension    x 5 yrs    Past Surgical History:  Procedure Laterality Date    rt knee arthroscopy     BIOPSY  03/21/2020   Procedure: BIOPSY;  Surgeon: Harvel Quale, MD;  [facility];  Service:  Gastroenterology;;   COLONOSCOPY  04/10/2012   Procedure: COLONOSCOPY;  Surgeon: Rogene Houston, MD;  [facility];  Service: Endoscopy;  Laterality: N/A;  1200   COLONOSCOPY WITH PROPOFOL N/A 03/21/2020   Procedure: COLONOSCOPY WITH PROPOFOL;  Surgeon: Harvel Quale, MD;  [facility];  Service: Gastroenterology;  Laterality: N/A;  10:00   POLYPECTOMY  03/21/2020   Procedure: POLYPECTOMY;  Surgeon: Montez Morita, Quillian Quince, MD;  [facility];  Service: Gastroenterology;;    Social History   Socioeconomic History   Marital status: Married    Spouse name: Not on file   Number of children: Not on file   Years of education: Not on file   Highest education level: Not on file  Occupational History   Not on file  Tobacco Use   Smoking status: Every Day   Smokeless tobacco: Never   Tobacco comments:    Cigar a day  Vaping Use   Vaping Use: Never used  Substance and Sexual Activity   Alcohol use: Yes    Comment: 6 pack a week   Drug use: No   Sexual activity: Not on file  Other Topics Concern   Not on file  Social History Narrative   Married for 24 yrs.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   BD PEN NEEDLE NANO 2ND GEN 32G X 4 MM MISC USE TO CHECK BLOOD SUGAR DAILY   insulin detemir (LEVEMIR) 100 UNIT/ML FlexPen Inject 20 Units into the skin daily.   JARDIANCE 25 MG TABS tablet TAKE 25 MG BY MOUTH DAILY BEFORE BREAKFAST.   [DISCONTINUED] benazepril (LOTENSIN) 40 MG tablet TAKE 1 TABLET BY MOUTH EVERY DAY   [DISCONTINUED]  Dulaglutide (TRULICITY) 3 0000000 SOPN Inject 3 mg as directed once a week.   [DISCONTINUED] NP THYROID 120 MG tablet Take 1 tablet (120 mg total) by mouth daily before breakfast.   benazepril (LOTENSIN) 40 MG tablet Take 1 tablet (40 mg total) by mouth daily.   Dulaglutide (TRULICITY) 3 0000000 SOPN Inject 3 mg as directed once a week.   NP THYROID 120 MG tablet Take 1 tablet (120 mg total) by mouth daily before breakfast.   [DISCONTINUED] Testosterone 20 % CREA Apply 100 mg topically daily at 12 noon.   [DISCONTINUED] testosterone cypionate (DEPO-TESTOSTERONE) 200 MG/ML injection Inject 0.5 mLs (100 mg total) into the muscle every 7 (seven) days.   No facility-administered encounter medications on file as of 03/27/2021.    Allergies  Allergen Reactions   Codeine     Hives, itching     Review of Systems  Constitutional:  Negative for activity change, appetite change, chills, diaphoresis, fever and unexpected weight change.  HENT: Negative.    Eyes: Negative.  Negative for photophobia and visual disturbance.  Respiratory:  Negative for cough and chest tightness.   Cardiovascular:  Negative for leg swelling.  Gastrointestinal:  Negative for abdominal pain, blood in stool, constipation, diarrhea, nausea and vomiting.  Endocrine: Negative.   Genitourinary:  Negative for decreased urine volume, difficulty urinating, dysuria, frequency, penile discharge, penile pain, penile swelling, scrotal swelling, testicular pain and urgency.  Musculoskeletal:  Negative for arthralgias and myalgias.  Skin: Negative.   Allergic/Immunologic: Negative.   Neurological:  Negative for syncope, facial asymmetry, light-headedness and numbness.  Hematological: Negative.   Psychiatric/Behavioral:  Negative for hallucinations, sleep disturbance and suicidal ideas.   All other systems reviewed and are negative.      Objective:  BP 129/90   Pulse (!) 106   Ht '5\' 5"'$  (1.651 m)   Wt 146 lb (66.2 kg)   SpO2  97%   BMI 24.30 kg/m    Wt Readings from Last 3 Encounters:  03/27/21 146 lb (66.2 kg)  01/03/21 144 lb 9.6 oz (65.6 kg)  11/30/20 149 lb 4.8 oz (67.7 kg)    Physical Exam Vitals and nursing note reviewed.  Constitutional:      General: He is not in acute distress.    Appearance: Normal appearance. He is well-developed, well-groomed and normal weight. He is not ill-appearing, toxic-appearing or diaphoretic.  HENT:     Head: Normocephalic and atraumatic.     Jaw: There is normal jaw occlusion.     Right Ear: Hearing normal.     Left Ear: Hearing normal.     Nose: Nose normal.     Mouth/Throat:     Lips: Pink.     Mouth: Mucous membranes are  moist.     Pharynx: Oropharynx is clear. Uvula midline.  Eyes:     General: Lids are normal.     Extraocular Movements: Extraocular movements intact.     Conjunctiva/sclera: Conjunctivae normal.     Pupils: Pupils are equal, round, and reactive to light.  Neck:     Thyroid: No thyroid mass, thyromegaly or thyroid tenderness.     Vascular: No carotid bruit or JVD.     Trachea: Trachea and phonation normal.  Cardiovascular:     Rate and Rhythm: Normal rate and regular rhythm.     Chest Wall: PMI is not displaced.     Pulses: Normal pulses.     Heart sounds: Normal heart sounds. No murmur heard.   No friction rub. No gallop.  Pulmonary:     Effort: Pulmonary effort is normal. No respiratory distress.     Breath sounds: Normal breath sounds. No wheezing.  Abdominal:     General: Bowel sounds are normal. There is no distension or abdominal bruit.     Palpations: Abdomen is soft. There is no hepatomegaly or splenomegaly.     Tenderness: There is no abdominal tenderness. There is no right CVA tenderness or left CVA tenderness.     Hernia: No hernia is present.  Musculoskeletal:        General: Normal range of motion.     Cervical back: Normal range of motion and neck supple.     Right lower leg: No edema.     Left lower leg: No edema.   Lymphadenopathy:     Cervical: No cervical adenopathy.  Skin:    General: Skin is warm and dry.     Capillary Refill: Capillary refill takes less than 2 seconds.     Coloration: Skin is not cyanotic, jaundiced or pale.     Findings: No rash.  Neurological:     General: No focal deficit present.     Mental Status: He is alert and oriented to person, place, and time.     Cranial Nerves: Cranial nerves are intact. No cranial nerve deficit.     Sensory: Sensation is intact. No sensory deficit.     Motor: Motor function is intact. No weakness.     Coordination: Coordination is intact. Coordination normal.     Gait: Gait is intact. Gait normal.     Deep Tendon Reflexes: Reflexes are normal and symmetric.  Psychiatric:        Attention and Perception: Attention and perception normal.        Mood and Affect: Mood and affect normal.        Speech: Speech normal.        Behavior: Behavior normal. Behavior is cooperative.        Thought Content: Thought content normal.        Cognition and Memory: Cognition and memory normal.        Judgment: Judgment normal.    Results for orders placed or performed in visit on 01/03/21  COMPLETE METABOLIC PANEL WITH GFR  Result Value Ref Range   Glucose, Bld 266 (H) 65 - 139 mg/dL   BUN 12 7 - 25 mg/dL   Creat 0.97 0.70 - 1.33 mg/dL   GFR, Est Non African American 91 > OR = 60 mL/min/1.25m   GFR, Est African American 105 > OR = 60 mL/min/1.735m  BUN/Creatinine Ratio NOT APPLICABLE 6 - 22 (calc)   Sodium 138 135 - 146 mmol/L   Potassium 4.9 3.5 -  5.3 mmol/L   Chloride 101 98 - 110 mmol/L   CO2 26 20 - 32 mmol/L   Calcium 10.5 (H) 8.6 - 10.3 mg/dL   Total Protein 7.5 6.1 - 8.1 g/dL   Albumin 4.9 3.6 - 5.1 g/dL   Globulin 2.6 1.9 - 3.7 g/dL (calc)   AG Ratio 1.9 1.0 - 2.5 (calc)   Total Bilirubin 0.3 0.2 - 1.2 mg/dL   Alkaline phosphatase (APISO) 94 35 - 144 U/L   AST 32 10 - 35 U/L   ALT 38 9 - 46 U/L  Hemoglobin A1c  Result Value Ref Range    Hgb A1c MFr Bld 9.7 (H) <5.7 % of total Hgb   Mean Plasma Glucose 232 mg/dL   eAG (mmol/L) 12.8 mmol/L  TSH  Result Value Ref Range   TSH 0.34 (L) 0.40 - 4.50 mIU/L  T3, free  Result Value Ref Range   T3, Free 4.0 2.3 - 4.2 pg/mL       Pertinent labs & imaging results that were available during my care of the patient were reviewed by me and considered in my medical decision making.  Assessment & Plan:  Javale was seen today for medical management of chronic issues, new patient (initial visit), diabetes and hypertension.  Diagnoses and all orders for this visit:  Encounter to establish care Pts former PCP unrepentantly passed away and he is here to establish with new provider. Last saw his PCP 12/2020. Labs reviewed in detail.   Hypertension associated with diabetes ([place]) Fairly controlled, has not had Lotensin today. DASH diet and exercise encouraged. Continue current regimen. Monitor and report any persistent high or low readings. If this remains elevated, will adjust regimen.  -     benazepril (LOTENSIN) 40 MG tablet; Take 1 tablet (40 mg total) by mouth daily.  Uncontrolled type 2 diabetes mellitus with complication, with long-term current use of insulin ([facility]) A1C due next month, last A1C 9.7. Will recheck next month and adjust regimen accordingly. Diet and exercise discussed in detail.  -     Dulaglutide (TRULICITY) 3 0000000 SOPN; Inject 3 mg as directed once a week.  Hyperlipidemia associated with type 2 diabetes mellitus ([place]) Will obtain labs at next visit. Not on statin therapy and does not wish to start. Diet and exercise encouraged.   Hypothyroidism (acquired) Last TSH slightly low, will check at upcoming visit and change repletion dose if warranted.  -     NP THYROID 120 MG tablet; Take 1 tablet (120 mg total) by mouth daily before breakfast.  Vitamin D deficiency On repletion therapy, will check Vit D at next visit and adjust repletion if warranted.      Continue all other maintenance medications.  Follow up plan: Return in about 1 month (around 04/27/2021), or if symptoms worsen or fail to improve, for DM.   Continue healthy lifestyle choices, including diet (rich in fruits, vegetables, and lean proteins, and low in salt and simple carbohydrates) and exercise (at least 30 minutes of moderate physical activity daily).  Educational handout given for DM  The above assessment and management plan was discussed with the patient. The patient verbalized understanding of and has agreed to the management plan. Patient is aware to call the clinic if they develop any new symptoms or if symptoms persist or worsen. Patient is aware when to return to the clinic for a follow-up visit. Patient educated on when it is appropriate to go to the emergency department.   Monia Pouch,  FNP-C [place] [phone]

## 2021-05-01 ENCOUNTER — Encounter: Payer: Self-pay | Admitting: *Deleted

## 2021-05-01 ENCOUNTER — Ambulatory Visit: Payer: 59 | Admitting: Family Medicine

## 2021-05-01 ENCOUNTER — Other Ambulatory Visit: Payer: Self-pay

## 2021-05-01 ENCOUNTER — Encounter: Payer: Self-pay | Admitting: Family Medicine

## 2021-05-01 VITALS — BP 132/86 | HR 99 | Temp 97.7°F | Ht 65.0 in | Wt 148.0 lb

## 2021-05-01 DIAGNOSIS — E1169 Type 2 diabetes mellitus with other specified complication: Secondary | ICD-10-CM

## 2021-05-01 DIAGNOSIS — E785 Hyperlipidemia, unspecified: Secondary | ICD-10-CM

## 2021-05-01 DIAGNOSIS — E039 Hypothyroidism, unspecified: Secondary | ICD-10-CM | POA: Diagnosis not present

## 2021-05-01 DIAGNOSIS — E1165 Type 2 diabetes mellitus with hyperglycemia: Secondary | ICD-10-CM | POA: Diagnosis not present

## 2021-05-01 DIAGNOSIS — E1159 Type 2 diabetes mellitus with other circulatory complications: Secondary | ICD-10-CM | POA: Diagnosis not present

## 2021-05-01 DIAGNOSIS — E559 Vitamin D deficiency, unspecified: Secondary | ICD-10-CM

## 2021-05-01 DIAGNOSIS — I152 Hypertension secondary to endocrine disorders: Secondary | ICD-10-CM

## 2021-05-01 LAB — BAYER DCA HB A1C WAIVED: HB A1C (BAYER DCA - WAIVED): 8.6 % — ABNORMAL HIGH (ref 4.8–5.6)

## 2021-05-01 NOTE — Progress Notes (Signed)
Subjective:  Patient ID: Justin [name], male    DOB: Mar 09, 1971, 51 y.o.   MRN: [id number]  Patient Care Team: Baruch Gouty, FNP as PCP - General (Family Medicine) Harl Bowie Alphonse Guild, MD as PCP - Cardiology (Cardiology) Celestia Khat, OD (Optometry)   Chief Complaint:  Diabetes (Labs) and Medical Management of Chronic Issues   HPI: Justin [name] is a 50 y.o. male presenting on 05/01/2021 for Diabetes (Labs) and Medical Management of Chronic Issues   Pt presents today for management of chronic medical conditions. He has Vit D deficiency and is compliant with repletion therapy. He denies arthralgias or myalgias.   Diabetes He presents for his follow-up diabetic visit. He has type 2 diabetes mellitus. No MedicAlert identification noted. His disease course has been fluctuating. There are no hypoglycemic associated symptoms. Pertinent negatives for hypoglycemia include no confusion, dizziness, headaches, hunger, mood changes, nervousness/anxiousness, pallor, seizures, sleepiness, speech difficulty, sweats or tremors. Pertinent negatives for diabetes include no blurred vision, no chest pain, no fatigue, no foot paresthesias, no foot ulcerations, no polydipsia, no polyphagia, no polyuria, no visual change, no weakness and no weight loss. There are no hypoglycemic complications. Diabetic complications include heart disease and impotence. Pertinent negatives for diabetic complications include no CVA, PVD or retinopathy. Risk factors for coronary artery disease include diabetes mellitus, dyslipidemia, family history, hypertension and male sex. Current diabetic treatment includes insulin injections and oral agent (monotherapy). He is compliant with treatment most of the time. He is following a generally healthy diet. When asked about meal planning, he reported none. He has not had a previous visit with a dietitian. He participates in exercise intermittently. Blood glucose monitoring compliance is poor.  An ACE inhibitor/angiotensin II receptor blocker is being taken. He does not see a podiatrist.Eye exam is current.  Hypertension This is a chronic problem. The current episode started more than 1 year ago. The problem has been waxing and waning since onset. The problem is controlled. Pertinent negatives include no anxiety, blurred vision, chest pain, headaches, malaise/fatigue, neck pain, orthopnea, palpitations, peripheral edema, PND, shortness of breath or sweats. Agents associated with hypertension include thyroid hormones. Risk factors for coronary artery disease include diabetes mellitus, dyslipidemia, family history and male gender. Past treatments include angiotensin blockers and calcium channel blockers. The current treatment provides moderate improvement. Compliance problems include exercise and diet.  There is no history of angina, kidney disease, CAD/MI, CVA, heart failure, left ventricular hypertrophy, PVD or retinopathy. Identifiable causes of hypertension include a thyroid problem. There is no history of chronic renal disease, coarctation of the aorta, hyperaldosteronism, hypercortisolism, hyperparathyroidism, a hypertension causing med, pheochromocytoma, renovascular disease or sleep apnea.  Hyperlipidemia This is a chronic problem. The problem is uncontrolled. Recent lipid tests were reviewed and are high. Exacerbating diseases include diabetes and hypothyroidism. He has no history of chronic renal disease, liver disease, obesity or nephrotic syndrome. There are no known factors aggravating his hyperlipidemia. Pertinent negatives include no chest pain, focal sensory loss, focal weakness, leg pain, myalgias or shortness of breath. Current antihyperlipidemic treatment includes diet change. Compliance problems include adherence to diet and adherence to exercise.  Risk factors for coronary artery disease include diabetes mellitus, dyslipidemia, hypertension, male sex and family history.  Thyroid  Problem Presents for follow-up visit. Patient reports no anxiety, cold intolerance, constipation, depressed mood, diaphoresis, fatigue, hair loss, heat intolerance, hoarse voice, leg swelling, nail problem, palpitations, tremors, visual change, weight gain or weight loss. The symptoms have been stable.  His past medical history is significant for diabetes and hyperlipidemia. There is no history of heart failure.    Relevant past medical, surgical, family, and social history reviewed and updated as indicated.  Allergies and medications reviewed and updated. Data reviewed: Chart in Epic.   Past Medical History:  Diagnosis Date   Diabetes mellitus ([place])    Type 2 x 9 yrs.   Hyperlipidemia    Hypertension    x 5 yrs    Past Surgical History:  Procedure Laterality Date    rt knee arthroscopy     BIOPSY  03/21/2020   Procedure: BIOPSY;  Surgeon: Harvel Quale, MD;  [facility];  Service: Gastroenterology;;   COLONOSCOPY  04/10/2012   Procedure: COLONOSCOPY;  Surgeon: Rogene Houston, MD;  [facility]: [facility];  Service: Endoscopy;  Laterality: N/A;  1200   COLONOSCOPY WITH PROPOFOL N/A 03/21/2020   Procedure: COLONOSCOPY WITH PROPOFOL;  Surgeon: Harvel Quale, MD;  Location: [facility];  Service: Gastroenterology;  Laterality: N/A;  10:00   POLYPECTOMY  03/21/2020   Procedure: POLYPECTOMY;  Surgeon: Montez Morita, Quillian Quince, MD;  Location: [facility];  Service: Gastroenterology;;    Social History   Socioeconomic History   Marital status: Married    Spouse name: Not on file   Number of children: Not on file   Years of education: Not on file   Highest education level: Not on file  Occupational History   Not on file  Tobacco Use   Smoking status: Every Day   Smokeless tobacco: Never   Tobacco comments:    Cigar a day  Vaping Use   Vaping Use: Never used  Substance and Sexual Activity   Alcohol use: Yes    Comment: 6 pack a week   Drug  use: No   Sexual activity: Not on file  Other Topics Concern   Not on file  Social History Narrative   Married for 24 yrs.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 Swallow whole.   amLODipine (NORVASC) 5 MG tablet Take 1 tablet (5 mg total) by mouth daily.   BD PEN NEEDLE NANO 2ND GEN 32G X 4 MM MISC USE TO CHECK BLOOD SUGAR DAILY   benazepril (LOTENSIN) 40 MG tablet Take 1 tablet (40 mg total) by mouth daily.   Dulaglutide (TRULICITY) 3 AN/1.9TY SOPN Inject 3 mg as directed once a week.   insulin detemir (LEVEMIR) 100 UNIT/ML FlexPen Inject 20 Units into the skin daily.   JARDIANCE 25 MG TABS tablet TAKE 25 MG BY MOUTH DAILY BEFORE BREAKFAST.   NP THYROID 120 MG tablet Take 1 tablet (120 mg total) by mouth daily before breakfast.   No facility-administered encounter medications on file as of 05/01/2021.    Allergies  Allergen Reactions   Codeine     Hives, itching     Review of Systems  Constitutional:  Negative for activity change, appetite change, chills, diaphoresis, fatigue, fever, malaise/fatigue, unexpected weight change, weight gain and weight loss.  HENT: Negative.  Negative  for hoarse voice.   Eyes: Negative.  Negative for blurred vision, photophobia and visual disturbance.  Respiratory:  Negative for cough, chest tightness and shortness of breath.   Cardiovascular:  Negative for chest pain, palpitations, orthopnea, leg swelling and PND.  Gastrointestinal:  Negative for abdominal pain, blood in stool, constipation, nausea and vomiting.  Endocrine: Negative.  Negative for cold  intolerance, heat intolerance, polydipsia, polyphagia and polyuria.  Genitourinary:  Positive for impotence. Negative for decreased urine volume, difficulty urinating, dysuria, frequency and urgency.  Musculoskeletal:  Negative for arthralgias, myalgias and neck pain.  Skin: Negative.  Negative for pallor.  Allergic/Immunologic: Negative.   Neurological:  Negative for dizziness, tremors, focal weakness, seizures, syncope, facial asymmetry, speech difficulty, weakness, light-headedness, numbness and headaches.  Hematological: Negative.   Psychiatric/Behavioral:  Negative for confusion, hallucinations, sleep disturbance and suicidal ideas. The patient is not nervous/anxious.   All other systems reviewed and are negative.      Objective:  BP 132/86 (BP Location: Left Arm, Cuff Size: Normal)   Pulse 99   Temp 97.7 F (36.5 C)   Ht $R'5\' 5"'YZ$  (1.651 m)   Wt 148 lb (67.1 kg)   SpO2 98%   BMI 24.63 kg/m    Wt Readings from Last 3 Encounters:  05/01/21 148 lb (67.1 kg)  03/27/21 146 lb (66.2 kg)  01/03/21 144 lb 9.6 oz (65.6 kg)    Physical Exam Vitals and nursing note reviewed.  Constitutional:      General: He is not in acute distress.    Appearance: Normal appearance. He is well-developed, well-groomed and normal weight. He is not ill-appearing, toxic-appearing or diaphoretic.  HENT:     Head: Normocephalic and atraumatic.     Jaw: There is normal jaw occlusion.     Right Ear: Hearing normal.     Left Ear: Hearing normal.     Nose: Nose normal.     Mouth/Throat:     Lips: Pink.     Mouth: Mucous membranes are moist.     Pharynx: Oropharynx is clear. Uvula midline.  Eyes:     General: Lids are normal.     Extraocular Movements: Extraocular movements intact.     Conjunctiva/sclera: Conjunctivae normal.     Pupils: Pupils are equal, round, and reactive to light.  Neck:     Thyroid: No thyroid mass, thyromegaly or thyroid tenderness.     Vascular: No carotid bruit or JVD.      Trachea: Trachea and phonation normal.  Cardiovascular:     Rate and Rhythm: Normal rate and regular rhythm.     Chest Wall: PMI is not displaced.     Pulses: Normal pulses.     Heart sounds: Normal heart sounds. No murmur heard.   No friction rub. No gallop.  Pulmonary:     Effort: Pulmonary effort is normal. No respiratory distress.     Breath sounds: Normal breath sounds. No wheezing.  Abdominal:     General: Bowel sounds are normal. There is no distension or abdominal bruit.     Palpations: Abdomen is soft. There is no hepatomegaly or splenomegaly.     Tenderness: There is no abdominal tenderness. There is no right CVA tenderness or left CVA tenderness.     Hernia: No hernia is present.  Musculoskeletal:        General: Normal range of motion.     Cervical back: Normal range of motion and neck supple.     Right lower leg: No edema.  Left lower leg: No edema.  Lymphadenopathy:     Cervical: No cervical adenopathy.  Skin:    General: Skin is warm and dry.     Capillary Refill: Capillary refill takes less than 2 seconds.     Coloration: Skin is not cyanotic, jaundiced or pale.     Findings: No rash.  Neurological:     General: No focal deficit present.     Mental Status: He is alert and oriented to person, place, and time.     Cranial Nerves: Cranial nerves are intact.     Sensory: Sensation is intact.     Motor: Motor function is intact.     Coordination: Coordination is intact.     Gait: Gait is intact.     Deep Tendon Reflexes: Reflexes are normal and symmetric.  Psychiatric:        Attention and Perception: Attention and perception normal.        Mood and Affect: Mood and affect normal.        Speech: Speech normal.        Behavior: Behavior normal. Behavior is cooperative.        Thought Content: Thought content normal.        Cognition and Memory: Cognition and memory normal.        Judgment: Judgment normal.    Results for orders placed or performed in visit  on 01/03/21  COMPLETE METABOLIC PANEL WITH GFR  Result Value Ref Range   Glucose, Bld 266 (H) 65 - 139 mg/dL   BUN 12 7 - 25 mg/dL   Creat 0.97 0.70 - 1.33 mg/dL   GFR, Est Non African American 91 > OR = 60 mL/min/1.80m2   GFR, Est African American 105 > OR = 60 mL/min/1.64m2   BUN/Creatinine Ratio NOT APPLICABLE 6 - 22 (calc)   Sodium 138 135 - 146 mmol/L   Potassium 4.9 3.5 - 5.3 mmol/L   Chloride 101 98 - 110 mmol/L   CO2 26 20 - 32 mmol/L   Calcium 10.5 (H) 8.6 - 10.3 mg/dL   Total Protein 7.5 6.1 - 8.1 g/dL   Albumin 4.9 3.6 - 5.1 g/dL   Globulin 2.6 1.9 - 3.7 g/dL (calc)   AG Ratio 1.9 1.0 - 2.5 (calc)   Total Bilirubin 0.3 0.2 - 1.2 mg/dL   Alkaline phosphatase (APISO) 94 35 - 144 U/L   AST 32 10 - 35 U/L   ALT 38 9 - 46 U/L  Hemoglobin A1c  Result Value Ref Range   Hgb A1c MFr Bld 9.7 (H) <5.7 % of total Hgb   Mean Plasma Glucose 232 mg/dL   eAG (mmol/L) 12.8 mmol/L  TSH  Result Value Ref Range   TSH 0.34 (L) 0.40 - 4.50 mIU/L  T3, free  Result Value Ref Range   T3, Free 4.0 2.3 - 4.2 pg/mL       Pertinent labs & imaging results that were available during my care of the patient were reviewed by me and considered in my medical decision making.  Assessment & Plan:  Vidyuth was seen today for diabetes and medical management of chronic issues.  Diagnoses and all orders for this visit:  Uncontrolled type 2 diabetes mellitus with hyperglycemia (HCC) A1C 8.6 today, greatly improved from 9.7. Pt will restart ASA 81 mg daily. Is on ARB. Declines statin therapy. Dietary changes to be made. Will recheck A1C in 3 months, if still elevated, will adjust regimen. Labs pending. Diet and exercise  discussed in detail.  -     Bayer DCA Hb A1c Waived -     Thyroid Panel With TSH -     CBC with Differential/Platelet -     Microalbumin / creatinine urine ratio -     Lipid panel -     CMP14+EGFR  Hypertension associated with diabetes ([place]) DASH diet and exercise encouraged. If  remains above goal, will increase Norvasc to 10 mg. Labs pending.  -     Bayer DCA Hb A1c Waived -     Thyroid Panel With TSH -     CBC with Differential/Platelet -     Microalbumin / creatinine urine ratio -     Lipid panel -     CMP14+EGFR  Hyperlipidemia associated with type 2 diabetes mellitus ([place]) Declines statin therapy. Diet and exercise encouraged. Labs pending. -     Bayer DCA Hb A1c Waived -     Thyroid Panel With TSH -     CBC with Differential/Platelet -     Microalbumin / creatinine urine ratio -     Lipid panel -     CMP14+EGFR  Hypothyroidism (acquired) Labs pending. Currently on NP thyroid. Will change to Synthroid 200 mcg if TSH normal. Therapy will be adjusted if warranted.  -     Thyroid Panel With TSH -     CMP14+EGFR  Vitamin D deficiency Labs pending. Will adjust repletion therapy if warranted.  -     VITAMIN D 25 Hydroxy (Vit-D Deficiency, Fractures)     Continue all other maintenance medications.  Follow up plan: Return in about 3 months (around 08/01/2021), or if symptoms worsen or fail to improve, for DM.   Continue healthy lifestyle choices, including diet (rich in fruits, vegetables, and lean proteins, and low in salt and simple carbohydrates) and exercise (at least 30 minutes of moderate physical activity daily).  Educational handout given for DM  The above assessment and management plan was discussed with the patient. The patient verbalized understanding of and has agreed to the management plan. Patient is aware to call the clinic if they develop any new symptoms or if symptoms persist or worsen. Patient is aware when to return to the clinic for a follow-up visit. Patient educated on when it is appropriate to go to the emergency department.   Monia Pouch, FNP-C [facility] [phone]

## 2021-05-01 NOTE — Patient Instructions (Signed)

## 2021-05-02 LAB — CMP14+EGFR
ALT: 23 IU/L (ref 0–44)
AST: 17 IU/L (ref 0–40)
Albumin/Globulin Ratio: 2.1 (ref 1.2–2.2)
Albumin: 5 g/dL (ref 4.0–5.0)
Alkaline Phosphatase: 88 IU/L (ref 44–121)
BUN/Creatinine Ratio: 17 (ref 9–20)
BUN: 14 mg/dL (ref 6–24)
Bilirubin Total: 0.3 mg/dL (ref 0.0–1.2)
CO2: 23 mmol/L (ref 20–29)
Calcium: 10 mg/dL (ref 8.7–10.2)
Chloride: 101 mmol/L (ref 96–106)
Creatinine, Ser: 0.82 mg/dL (ref 0.76–1.27)
Globulin, Total: 2.4 g/dL (ref 1.5–4.5)
Glucose: 137 mg/dL — ABNORMAL HIGH (ref 70–99)
Potassium: 4 mmol/L (ref 3.5–5.2)
Sodium: 142 mmol/L (ref 134–144)
Total Protein: 7.4 g/dL (ref 6.0–8.5)
eGFR: 107 mL/min/{1.73_m2} (ref >59)

## 2021-05-02 LAB — CBC WITH DIFFERENTIAL/PLATELET
Basophils Absolute: 0 10*3/uL (ref 0.0–0.2)
Basos: 1 %
EOS (ABSOLUTE): 0.1 10*3/uL (ref 0.0–0.4)
Eos: 1 %
Hematocrit: 47.7 % (ref 37.5–51.0)
Hemoglobin: 16.2 g/dL (ref 13.0–17.7)
Immature Grans (Abs): 0 10*3/uL (ref 0.0–0.1)
Immature Granulocytes: 0 %
Lymphocytes Absolute: 2 10*3/uL (ref 0.7–3.1)
Lymphs: 31 %
MCH: 29.8 pg (ref 26.6–33.0)
MCHC: 34 g/dL (ref 31.5–35.7)
MCV: 88 fL (ref 79–97)
Monocytes Absolute: 0.7 10*3/uL (ref 0.1–0.9)
Monocytes: 11 %
Neutrophils Absolute: 3.6 10*3/uL (ref 1.4–7.0)
Neutrophils: 56 %
Platelets: 340 10*3/uL (ref 150–450)
RBC: 5.43 x10E6/uL (ref 4.14–5.80)
RDW: 13.2 % (ref 11.6–15.4)
WBC: 6.3 10*3/uL (ref 3.4–10.8)

## 2021-05-02 LAB — THYROID PANEL WITH TSH
Free Thyroxine Index: 1.6 (ref 1.2–4.9)
T3 Uptake Ratio: 26 % (ref 24–39)
T4, Total: 6.3 ug/dL (ref 4.5–12.0)
TSH: 0.017 u[IU]/mL — ABNORMAL LOW (ref 0.450–4.500)

## 2021-05-02 LAB — MICROALBUMIN / CREATININE URINE RATIO
Creatinine, Urine: 86.8 mg/dL
Microalb/Creat Ratio: 19 mg/g creat (ref 0–29)
Microalbumin, Urine: 16.9 ug/mL

## 2021-05-02 LAB — LIPID PANEL
Chol/HDL Ratio: 3.4 ratio (ref 0.0–5.0)
Cholesterol, Total: 205 mg/dL — ABNORMAL HIGH (ref 100–199)
HDL: 60 mg/dL (ref >39)
LDL Chol Calc (NIH): 125 mg/dL — ABNORMAL HIGH (ref 0–99)
Triglycerides: 113 mg/dL (ref 0–149)
VLDL Cholesterol Cal: 20 mg/dL (ref 5–40)

## 2021-05-02 LAB — VITAMIN D 25 HYDROXY (VIT D DEFICIENCY, FRACTURES): Vit D, 25-Hydroxy: 148.4 ng/mL — ABNORMAL HIGH (ref 30.0–100.0)

## 2021-05-02 MED ORDER — LEVOTHYROXINE SODIUM 200 MCG PO TABS
200.0000 ug | ORAL_TABLET | Freq: Every day | ORAL | 3 refills | Status: DC
Start: 2021-05-02 — End: 2021-08-02

## 2021-05-02 MED ORDER — PRAVASTATIN SODIUM 20 MG PO TABS: 20.0000 mg | ORAL_TABLET | Freq: Every day | ORAL | 3 refills | Status: DC

## 2021-05-02 NOTE — Addendum Note (Signed)
Addended by: Baruch Gouty on: 05/02/2021 08:01 AM   Modules accepted: Orders

## 2021-06-05 LAB — HM DIABETES EYE EXAM

## 2021-06-15 ENCOUNTER — Other Ambulatory Visit (INDEPENDENT_AMBULATORY_CARE_PROVIDER_SITE_OTHER): Payer: Self-pay | Admitting: Nurse Practitioner

## 2021-07-17 ENCOUNTER — Telehealth: Payer: Self-pay | Admitting: Family Medicine

## 2021-07-17 MED ORDER — EMPAGLIFLOZIN 25 MG PO TABS: 25.0000 mg | ORAL_TABLET | Freq: Every day | ORAL | 1 refills | Status: DC

## 2021-07-17 NOTE — Telephone Encounter (Signed)
°  Prescription Request  07/17/2021  Is this a "Controlled Substance" medicine? no  Have you seen your PCP in the last 2 weeks? no  If YES, route message to pool  -  If NO, patient needs to be scheduled for appointment.  What is the name of the medication or equipment? Benazepril 40 mg  Have you contacted your pharmacy to request a refill? YES   Which pharmacy would you like this sent to? [facility] in [place]   Patient notified that their request is being sent to the clinical staff for review and that they should receive a response within 2 business days.

## 2021-07-17 NOTE — Telephone Encounter (Signed)
Patient aware that [facility] is filling medicine they did not have in stock.

## 2021-08-01 ENCOUNTER — Encounter: Payer: Self-pay | Admitting: Family Medicine

## 2021-08-01 ENCOUNTER — Ambulatory Visit: Payer: 59 | Admitting: Family Medicine

## 2021-08-01 VITALS — BP 154/86 | HR 88 | Temp 98.4°F | Ht 65.0 in | Wt 151.0 lb

## 2021-08-01 DIAGNOSIS — E039 Hypothyroidism, unspecified: Secondary | ICD-10-CM | POA: Diagnosis not present

## 2021-08-01 DIAGNOSIS — E1169 Type 2 diabetes mellitus with other specified complication: Secondary | ICD-10-CM

## 2021-08-01 DIAGNOSIS — E1159 Type 2 diabetes mellitus with other circulatory complications: Secondary | ICD-10-CM

## 2021-08-01 DIAGNOSIS — I152 Hypertension secondary to endocrine disorders: Secondary | ICD-10-CM

## 2021-08-01 DIAGNOSIS — E1165 Type 2 diabetes mellitus with hyperglycemia: Secondary | ICD-10-CM | POA: Diagnosis not present

## 2021-08-01 DIAGNOSIS — E785 Hyperlipidemia, unspecified: Secondary | ICD-10-CM

## 2021-08-01 LAB — BAYER DCA HB A1C WAIVED: HB A1C (BAYER DCA - WAIVED): 9.3 % — ABNORMAL HIGH (ref 4.8–5.6)

## 2021-08-01 MED ORDER — PRAVASTATIN SODIUM 20 MG PO TABS: 20.0000 mg | ORAL_TABLET | Freq: Every day | ORAL | 3 refills | Status: DC

## 2021-08-01 MED ORDER — BENAZEPRIL HCL 40 MG PO TABS: 40.0000 mg | ORAL_TABLET | Freq: Every day | ORAL | 1 refills | Status: DC

## 2021-08-01 MED ORDER — AMLODIPINE BESYLATE 5 MG PO TABS: 5.0000 mg | ORAL_TABLET | Freq: Every day | ORAL | 1 refills | Status: DC

## 2021-08-01 MED ORDER — EMPAGLIFLOZIN 25 MG PO TABS: 25.0000 mg | ORAL_TABLET | Freq: Every day | ORAL | 1 refills | Status: DC

## 2021-08-01 MED ORDER — INSULIN DETEMIR 100 UNIT/ML FLEXPEN: 20.0000 [IU] | PEN_INJECTOR | Freq: Every day | SUBCUTANEOUS | 11 refills | Status: DC

## 2021-08-01 NOTE — Patient Instructions (Signed)

## 2021-08-01 NOTE — Progress Notes (Signed)
Subjective:  Patient ID: Justin [name], male    DOB: 03-25-1971, 51 y.o.   MRN: [id number]  Patient Care Team: Baruch Gouty, FNP as PCP - General (Family Medicine) Harl Bowie Alphonse Guild, MD as PCP - Cardiology (Cardiology) Celestia Khat, Isabela (Optometry)   Chief Complaint:  3 month follow up (Diabetes, thyroid)   HPI: Justin [name] is a 51 y.o. male presenting on 08/01/2021 for 3 month follow up (Diabetes, thyroid)   Diabetes He presents for his follow-up diabetic visit. He has type 2 diabetes mellitus. His disease course has been fluctuating. Pertinent negatives for hypoglycemia include no confusion, dizziness, headaches, hunger, mood changes, nervousness/anxiousness, pallor, seizures, sleepiness, speech difficulty, sweats or tremors. Associated symptoms include polydipsia and polyphagia. Pertinent negatives for diabetes include no blurred vision, no chest pain, no fatigue, no foot paresthesias, no foot ulcerations, no polyuria, no visual change, no weakness and no weight loss. Risk factors for coronary artery disease include diabetes mellitus, dyslipidemia, male sex, tobacco exposure and hypertension. Current diabetic treatment includes insulin injections and oral agent (monotherapy). He is compliant with treatment some of the time. His weight is stable. He is following a generally unhealthy diet. When asked about meal planning, he reported none. He participates in exercise intermittently. An ACE inhibitor/angiotensin II receptor blocker is being taken.  Thyroid Problem Presents for follow-up visit. Patient reports no anxiety, cold intolerance, constipation, depressed mood, diaphoresis, diarrhea, dry skin, fatigue, hair loss, heat intolerance, hoarse voice, leg swelling, menstrual problem, nail problem, palpitations, tremors, visual change, weight gain or weight loss. The symptoms have been stable. His past medical history is significant for diabetes and hyperlipidemia.   Hyperlipidemia Exacerbating diseases include diabetes and hypothyroidism. Pertinent negatives include no chest pain, myalgias or shortness of breath. Current antihyperlipidemic treatment includes diet change, statins and exercise. Compliance problems include adherence to diet.  Risk factors for coronary artery disease include diabetes mellitus, dyslipidemia, hypertension and male sex.  Hypertension This is a chronic problem. The current episode started more than 1 year ago. The problem has been waxing and waning since onset. The problem is uncontrolled (has been out of amlodipine for several weeks). Pertinent negatives include no anxiety, blurred vision, chest pain, headaches, malaise/fatigue, neck pain, orthopnea, palpitations, peripheral edema, PND, shortness of breath or sweats. Risk factors for coronary artery disease include dyslipidemia, diabetes mellitus, male gender and smoking/tobacco exposure. Past treatments include calcium channel blockers and ACE inhibitors. The current treatment provides significant improvement. Compliance problems include diet.  Identifiable causes of hypertension include a thyroid problem.     Relevant past medical, surgical, family, and social history reviewed and updated as indicated.  Allergies and medications reviewed and updated. Data reviewed: Chart in Epic.   Past Medical History:  Diagnosis Date   Diabetes mellitus ([place])    Type 2 x 9 yrs.   Hyperlipidemia    Hypertension    x 5 yrs    Past Surgical History:  Procedure Laterality Date    rt knee arthroscopy     BIOPSY  03/21/2020   Procedure: BIOPSY;  Surgeon: Harvel Quale, MD;  [facility];  Service: Gastroenterology;;   COLONOSCOPY  04/10/2012   Procedure: COLONOSCOPY;  Surgeon: Rogene Houston, MD;  [facility]: [facility];  Service: Endoscopy;  Laterality: N/A;  1200   COLONOSCOPY WITH PROPOFOL N/A 03/21/2020   Procedure: COLONOSCOPY WITH PROPOFOL;  Surgeon: Harvel Quale, MD;  Location: [facility];  Service: Gastroenterology;  Laterality:  N/A;  10:00   POLYPECTOMY  03/21/2020   Procedure: POLYPECTOMY;  Surgeon: Montez Morita, Quillian Quince, MD;  Location: [facility];  Service: Gastroenterology;;    Social History   Socioeconomic History   Marital status: Married    Spouse name: Not on file   Number of children: Not on file   Years of education: Not on file   Highest education level: Not on file  Occupational History   Not on file  Tobacco Use   Smoking status: Every Day   Smokeless tobacco: Never   Tobacco comments:    Cigar a day  Vaping Use   Vaping Use: Never used  Substance and Sexual Activity   Alcohol use: Yes    Comment: 6 pack a week   Drug use: No   Sexual activity: Not on file  Other Topics Concern   Not on file  Social History Narrative   Married for 24 yrs.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 IT for [employer]   Financial Resource Strain: Not on file  Food Insecurity: Not on file  Transportation Needs: Not on file  Physical Activity: Not on file  Stress: Not on file  Social Connections: Not on file  Intimate Partner Violence: Not on file    Outpatient Encounter Medications as of 08/01/2021  Medication Sig   aspirin EC 81 MG tablet Take 81 mg by mouth daily. Swallow whole.   BD PEN NEEDLE NANO 2ND GEN 32G X 4 MM MISC USE TO CHECK BLOOD SUGAR DAILY   Dulaglutide (TRULICITY) 3 FU/9.3AT SOPN Inject 3 mg as directed once a week.   levothyroxine (SYNTHROID) 200 MCG tablet Take 1 tablet (200 mcg total) by mouth daily.   [DISCONTINUED] amLODipine (NORVASC) 5 MG tablet Take 1 tablet (5 mg total) by mouth daily.   [DISCONTINUED] benazepril (LOTENSIN) 40 MG tablet Take 1 tablet (40 mg total) by mouth daily.   [DISCONTINUED] empagliflozin (JARDIANCE) 25 MG TABS tablet Take 1 tablet (25 mg total) by mouth daily before breakfast.   [DISCONTINUED] insulin detemir (LEVEMIR) 100 UNIT/ML FlexPen  Inject 20 Units into the skin daily.   [DISCONTINUED] pravastatin (PRAVACHOL) 20 MG tablet Take 1 tablet (20 mg total) by mouth daily.   amLODipine (NORVASC) 5 MG tablet Take 1 tablet (5 mg total) by mouth daily.   benazepril (LOTENSIN) 40 MG tablet Take 1 tablet (40 mg total) by mouth daily.   empagliflozin (JARDIANCE) 25 MG TABS tablet Take 1 tablet (25 mg total) by mouth daily before breakfast.   insulin detemir (LEVEMIR) 100 UNIT/ML FlexPen Inject 20 Units into the skin daily.   pravastatin (PRAVACHOL) 20 MG tablet Take 1 tablet (20 mg total) by mouth daily.   No facility-administered encounter medications on file as of 08/01/2021.    Allergies  Allergen Reactions   Codeine     Hives, itching     Review of Systems  Constitutional:  Negative for activity change, appetite change, chills, diaphoresis, fatigue, fever, malaise/fatigue, unexpected weight change, weight gain and weight loss.  HENT: Negative.  Negative for hoarse voice.   Eyes: Negative.  Negative for blurred vision, photophobia and visual disturbance.  Respiratory:  Negative for cough, chest tightness and shortness of breath.   Cardiovascular:  Negative for chest pain, palpitations, orthopnea, leg swelling and PND.  Gastrointestinal:  Negative for abdominal pain, blood in stool, constipation, diarrhea, nausea and vomiting.  Endocrine: Positive for polydipsia and polyphagia. Negative for cold intolerance, heat intolerance and polyuria.  Genitourinary:  Negative for decreased urine volume, difficulty urinating, dysuria, frequency, menstrual problem and urgency.  Musculoskeletal:  Negative for arthralgias, myalgias and neck pain.  Skin: Negative.  Negative for pallor.  Allergic/Immunologic: Negative.   Neurological:  Negative for dizziness, tremors, seizures, syncope, facial asymmetry, speech difficulty, weakness, light-headedness, numbness and headaches.  Hematological: Negative.   Psychiatric/Behavioral:  Negative for  confusion, hallucinations, sleep disturbance and suicidal ideas. The patient is not nervous/anxious.   All other systems reviewed and are negative.      Objective:  BP (!) 154/86 (BP Location: Left Arm, Cuff Size: Normal)    Pulse 88    Temp 98.4 F (36.9 C)    Ht 5\' 5"  (1.651 m)    Wt 151 lb (68.5 kg)    SpO2 98%    BMI 25.13 kg/m    Wt Readings from Last 3 Encounters:  08/01/21 151 lb (68.5 kg)  05/01/21 148 lb (67.1 kg)  03/27/21 146 lb (66.2 kg)    Physical Exam Vitals and nursing note reviewed.  Constitutional:      General: He is not in acute distress.    Appearance: Normal appearance. He is well-developed, well-groomed and normal weight. He is not ill-appearing, toxic-appearing or diaphoretic.  HENT:     Head: Normocephalic and atraumatic.     Jaw: There is normal jaw occlusion.     Right Ear: Hearing normal.     Left Ear: Hearing normal.     Nose: Nose normal.     Mouth/Throat:     Lips: Pink.     Mouth: Mucous membranes are moist.     Pharynx: Oropharynx is clear. Uvula midline.  Eyes:     General: Lids are normal.     Extraocular Movements: Extraocular movements intact.     Conjunctiva/sclera: Conjunctivae normal.     Pupils: Pupils are equal, round, and reactive to light.  Neck:     Thyroid: No thyroid mass, thyromegaly or thyroid tenderness.     Vascular: No carotid bruit or JVD.     Trachea: Trachea and phonation normal.  Cardiovascular:     Rate and Rhythm: Normal rate and regular rhythm.     Chest Wall: PMI is not displaced.     Pulses: Normal pulses.     Heart sounds: Normal heart sounds. No murmur heard.   No friction rub. No gallop.  Pulmonary:     Effort: Pulmonary effort is normal. No respiratory distress.     Breath sounds: Normal breath sounds. No wheezing.  Abdominal:     General: There is no abdominal bruit.     Palpations: There is no hepatomegaly or splenomegaly.     Tenderness: There is no left CVA tenderness.  Musculoskeletal:         General: Normal range of motion.     Cervical back: Normal range of motion and neck supple.     Right lower leg: No edema.     Left lower leg: No edema.  Lymphadenopathy:     Cervical: No cervical adenopathy.  Skin:    General: Skin is warm and dry.     Capillary Refill: Capillary refill takes less than 2 seconds.     Coloration: Skin is not cyanotic, jaundiced or pale.     Findings: No rash.  Neurological:     General: No focal deficit present.     Mental Status: He is alert and oriented to person, place, and time.     Sensory: Sensation is  intact.     Motor: Motor function is intact.     Coordination: Coordination is intact.     Gait: Gait is intact.     Deep Tendon Reflexes: Reflexes are normal and symmetric.  Psychiatric:        Attention and Perception: Attention and perception normal.        Mood and Affect: Mood and affect normal.        Speech: Speech normal.        Behavior: Behavior normal. Behavior is cooperative.        Thought Content: Thought content normal.        Cognition and Memory: Cognition and memory normal.        Judgment: Judgment normal.    Results for orders placed or performed in visit on 05/18/21  HM DIABETES EYE EXAM  Result Value Ref Range   HM Diabetic Eye Exam No Retinopathy No Retinopathy       Pertinent labs & imaging results that were available during my care of the patient were reviewed by me and considered in my medical decision making.  Assessment & Plan:  Riel was seen today for 3 month follow up.  Diagnoses and all orders for this visit:  Uncontrolled type 2 diabetes mellitus with hyperglycemia ([place]) A1C 9.3 in office today but pt has been out of jardiance and trulicity for the last 1.5 months. Meds have been refilled. Discussed importance of better compliance with diet and medications for adequate control of diabetes. Education provided.  -     Bayer DCA Hb A1c Waived -     empagliflozin (JARDIANCE) 25 MG TABS tablet; Take 1  tablet (25 mg total) by mouth daily before breakfast. -     insulin detemir (LEVEMIR) 100 UNIT/ML FlexPen; Inject 20 Units into the skin daily.  Hypothyroidism (acquired) Labs pending, will adjust repletion therapy if warranted.  -     Thyroid Panel With TSH  Hyperlipidemia associated with type 2 diabetes mellitus ([place]) Diet and exercise encouraged. Will check labs at next visit and adjust regimen if warranted.  -     pravastatin (PRAVACHOL) 20 MG tablet; Take 1 tablet (20 mg total) by mouth daily.  Hypertension associated with diabetes ([place]) 154/86 manually.  Has been out of amlodipine for several weeks. Refilled today. Pt aware to take once home. DASH diet and exercise encouraged. Monitor and report any persistent high readings.  -     amLODipine (NORVASC) 5 MG tablet; Take 1 tablet (5 mg total) by mouth daily. -     benazepril (LOTENSIN) 40 MG tablet; Take 1 tablet (40 mg total) by mouth daily.     Continue all other maintenance medications.  Follow up plan: Return in about 3 months (around 10/30/2021), or if symptoms worsen or fail to improve, for DM, thyroid, HTN, Lipids.   Continue healthy lifestyle choices, including diet (rich in fruits, vegetables, and lean proteins, and low in salt and simple carbohydrates) and exercise (at least 30 minutes of moderate physical activity daily).  Educational handout given for DM  The above assessment and management plan was discussed with the patient. The patient verbalized understanding of and has agreed to the management plan. Patient is aware to call the clinic if they develop any new symptoms or if symptoms persist or worsen. Patient is aware when to return to the clinic for a follow-up visit. Patient educated on when it is appropriate to go to the emergency department.   Monia Pouch, FNP-C  [facility] [phone]

## 2021-08-02 LAB — THYROID PANEL WITH TSH
Free Thyroxine Index: 2.7 (ref 1.2–4.9)
T3 Uptake Ratio: 31 % (ref 24–39)
T4, Total: 8.8 ug/dL (ref 4.5–12.0)
TSH: <0.005 u[IU]/mL — ABNORMAL LOW (ref 0.450–4.500)

## 2021-08-02 MED ORDER — LEVOTHYROXINE SODIUM 175 MCG PO TABS
175.0000 ug | ORAL_TABLET | Freq: Every day | ORAL | 3 refills | Status: DC
Start: 2021-08-02 — End: 2021-10-25

## 2021-08-02 NOTE — Addendum Note (Signed)
Addended by: Baruch Gouty on: 08/02/2021 07:51 AM   Modules accepted: Orders

## 2021-08-05 ENCOUNTER — Other Ambulatory Visit: Payer: Self-pay | Admitting: Family Medicine

## 2021-08-06 ENCOUNTER — Telehealth: Payer: Self-pay | Admitting: *Deleted

## 2021-08-06 DIAGNOSIS — E1165 Type 2 diabetes mellitus with hyperglycemia: Secondary | ICD-10-CM

## 2021-08-06 NOTE — Telephone Encounter (Signed)
Approved today Your PA request has been approved. Additional information will be provided in the approval communication. Trulicity 3 GU/5.4YH 0/62 ds//Urgent//E11.65 Type 2 diabetes mellitus with hyperglycemia//Approve 36 months. 4 pens (2 ML) 21 days or 12 pens (6 ML) 63 days.  [facility] aware

## 2021-08-06 NOTE — Telephone Encounter (Signed)
Trulicity 3MG /0.5ML pen-injectors PA started  Key: [id number]  Sent to Plan today

## 2021-08-06 NOTE — Telephone Encounter (Signed)
Question response done - sent to plan

## 2021-10-25 ENCOUNTER — Other Ambulatory Visit: Payer: Self-pay | Admitting: Family Medicine

## 2021-10-25 DIAGNOSIS — E039 Hypothyroidism, unspecified: Secondary | ICD-10-CM

## 2021-10-30 ENCOUNTER — Ambulatory Visit: Payer: 59 | Admitting: Family Medicine

## 2021-10-30 ENCOUNTER — Encounter: Payer: Self-pay | Admitting: Family Medicine

## 2021-10-30 VITALS — BP 119/70 | HR 104 | Temp 98.2°F | Resp 18 | Ht 65.0 in | Wt 152.0 lb

## 2021-10-30 DIAGNOSIS — E1169 Type 2 diabetes mellitus with other specified complication: Secondary | ICD-10-CM | POA: Diagnosis not present

## 2021-10-30 DIAGNOSIS — E1159 Type 2 diabetes mellitus with other circulatory complications: Secondary | ICD-10-CM | POA: Diagnosis not present

## 2021-10-30 DIAGNOSIS — E1165 Type 2 diabetes mellitus with hyperglycemia: Secondary | ICD-10-CM | POA: Diagnosis not present

## 2021-10-30 DIAGNOSIS — E785 Hyperlipidemia, unspecified: Secondary | ICD-10-CM

## 2021-10-30 DIAGNOSIS — E039 Hypothyroidism, unspecified: Secondary | ICD-10-CM | POA: Diagnosis not present

## 2021-10-30 DIAGNOSIS — I152 Hypertension secondary to endocrine disorders: Secondary | ICD-10-CM

## 2021-10-30 LAB — BAYER DCA HB A1C WAIVED: HB A1C (BAYER DCA - WAIVED): 9.9 % — ABNORMAL HIGH (ref 4.8–5.6)

## 2021-10-30 MED ORDER — TRULICITY 3 MG/0.5ML ~~LOC~~ SOAJ: 3.0000 mg | SUBCUTANEOUS | 5 refills | Status: DC

## 2021-10-30 NOTE — Patient Instructions (Addendum)
Continue to monitor your blood sugars as we discussed and record them. Bring the log to your next appointment.  Take your medications as directed.   ? ?Goal Blood glucose:  ?  Fasting (before meals) = 80 to 130 ?  Within 2 hours of eating = less than 180 ? ? ?Understanding your Hemoglobin A1c: 9.9 today ? ? ? ? ?Diabetes Mellitus and Nutrition ? ? ? ?I think that you would greatly benefit from seeing a nutritionist. If this is something you are interested in, please call Dr Jenne Campus at [phone] to schedule an appointment. ? ? ?When you have diabetes (diabetes mellitus), it is very important to have healthy eating habits because your blood sugar (glucose) levels are greatly affected by what you eat and drink. Eating healthy foods in the appropriate amounts, at about the same times every day, can help you: ?Control your blood glucose. ?Lower your risk of heart disease. ?Improve your blood pressure. ?Reach or maintain a healthy weight. ? ?Every person with diabetes is different, and each person has different needs for a meal plan. Your health care provider may recommend that you work with a diet and nutrition specialist (dietitian) to make a meal plan that is best for you. Your meal plan may vary depending on factors such as: ?The calories you need. ?The medicines you take. ?Your weight. ?Your blood glucose, blood pressure, and cholesterol levels. ?Your activity level. ?Other health conditions you have, such as heart or kidney disease. ? ?How do carbohydrates affect me? ?Carbohydrates affect your blood glucose level more than any other type of food. Eating carbohydrates naturally increases the amount of glucose in your blood. Carbohydrate counting is a method for keeping track of how many carbohydrates you eat. Counting carbohydrates is important to keep your blood glucose at a healthy level, especially if you use insulin or take certain oral diabetes medicines. ?It is important to know how many carbohydrates you can  safely have in each meal. This is different for every person. Your dietitian can help you calculate how many carbohydrates you should have at each meal and for snack. ?Foods that contain carbohydrates include: ?Bread, cereal, rice, pasta, and crackers. ?Potatoes and corn. ?Peas, beans, and lentils. ?Milk and yogurt. ?Fruit and juice. ?Desserts, such as cakes, cookies, ice cream, and candy. ? ?How does alcohol affect me? ?Alcohol can cause a sudden decrease in blood glucose (hypoglycemia), especially if you use insulin or take certain oral diabetes medicines. Hypoglycemia can be a life-threatening condition. Symptoms of hypoglycemia (sleepiness, dizziness, and confusion) are similar to symptoms of having too much alcohol. ?If your health care provider says that alcohol is safe for you, follow these guidelines: ?Limit alcohol intake to no more than 1 drink per day for nonpregnant women and 2 drinks per day for men. One drink equals 12 oz of beer, 5 oz of wine, or 1? oz of hard liquor. ?Do not drink on an empty stomach. ?Keep yourself hydrated with water, diet soda, or unsweetened iced tea. ?Keep in mind that regular soda, juice, and other mixers may contain a lot of sugar and must be counted as carbohydrates. ? ?What are tips for following this plan? ? ?Reading food labels ?Start by checking the serving size on the label. The amount of calories, carbohydrates, fats, and other nutrients listed on the label are based on one serving of the food. Many foods contain more than one serving per package. ?Check the total grams (g) of carbohydrates in one serving. You can  calculate the number of servings of carbohydrates in one serving by dividing the total carbohydrates by 15. For example, if a food has 30 g of total carbohydrates, it would be equal to 2 servings of carbohydrates. ?Check the number of grams (g) of saturated and trans fats in one serving. Choose foods that have low or no amount of these fats. ?Check the number  of milligrams (mg) of sodium in one serving. Most people should limit total sodium intake to less than 2,300 mg per day. ?Always check the nutrition information of foods labeled as "low-fat" or "nonfat". These foods may be higher in added sugar or refined carbohydrates and should be avoided. ?Talk to your dietitian to identify your daily goals for nutrients listed on the label. ? ?Shopping ?Avoid buying canned, premade, or processed foods. These foods tend to be high in fat, sodium, and added sugar. ?Shop around the outside edge of the grocery store. This includes fresh fruits and vegetables, bulk grains, fresh meats, and fresh dairy. ? ?Cooking ?Use low-heat cooking methods, such as baking, instead of high-heat cooking methods like deep frying. ?Cook using healthy oils, such as olive, canola, or sunflower oil. ?Avoid cooking with butter, cream, or high-fat meats. ? ?Meal planning ?Eat meals and snacks regularly, preferably at the same times every day. Avoid going long periods of time without eating. ?Eat foods high in fiber, such as fresh fruits, vegetables, beans, and whole grains. Talk to your dietitian about how many servings of carbohydrates you can eat at each meal. ?Eat 4-6 ounces of lean protein each day, such as lean meat, chicken, fish, eggs, or tofu. 1 ounce is equal to 1 ounce of meat, chicken, or fish, 1 egg, or 1/4 cup of tofu. ?Eat some foods each day that contain healthy fats, such as avocado, nuts, seeds, and fish. ? ?Lifestyle ? ?Check your blood glucose regularly. ?Exercise at least 30 minutes 5 or more days each week, or as told by your health care provider. ?Take medicines as told by your health care provider. ?Do not use any products that contain nicotine or tobacco, such as cigarettes and e-cigarettes. If you need help quitting, ask your health care provider. ?Work with a Social worker or diabetes educator to identify strategies to manage stress and any emotional and social challenges. ? ?What are  some questions to ask my health care provider? ?Do I need to meet with a diabetes educator? ?Do I need to meet with a dietitian? ?What number can I call if I have questions? ?When are the best times to check my blood glucose? ? ?Where to find more information: ?American Diabetes Association: diabetes.org/food-and-fitness/food ?Academy of Nutrition and Dietetics: PokerClues.dk ?[employer] of Diabetes and Digestive and Kidney Diseases (NIH): ContactWire.be ? ?Summary ?A healthy meal plan will help you control your blood glucose and maintain a healthy lifestyle. ?Working with a diet and nutrition specialist (dietitian) can help you make a meal plan that is best for you. ?Keep in mind that carbohydrates and alcohol have immediate effects on your blood glucose levels. It is important to count carbohydrates and to use alcohol carefully. ?This information is not intended to replace advice given to you by your health care provider. Make sure you discuss any questions you have with your health care provider. ?Document Released: 04/11/2005 Document Revised: 08/19/2016 Document Reviewed: 08/19/2016 ?Elsevier Interactive Patient Education ? 2018 [place]. ? ? ? ?

## 2021-10-30 NOTE — Progress Notes (Signed)
?  ? ?Subjective:  ?Patient ID: Justin [name], male    DOB: Sep 28, 1970, 51 y.o.   MRN: [id number] ? ?Patient Care Team: ?Baruch Gouty, FNP as PCP - General (Family Medicine) ?Arnoldo Lenis, MD as PCP - Cardiology (Cardiology) ?Celestia Khat, OD (Optometry)  ? ?Chief Complaint:  Medication Management ? ? ?HPI: ?Justin [name] is a 51 y.o. male presenting on 10/30/2021 for Medication Management ? ? ?1. Uncontrolled type 2 diabetes mellitus with hyperglycemia ([place]) ?Has been out of Trulicity for 3 months due to backorder. States blood sugars have been running in the high 100s range. Denies polyuria, polyphagia, or polydipsia. Recent eye exam, results requested.  ? ?2. Hypothyroidism (acquired) ?Last TSH was low, Synthroid dose was decreased. He has tolerated this change well. Denies hypo- or hyperthyroid symptoms. Has been taking medications as prescribed.  ? ?3. Hyperlipidemia associated with type 2 diabetes mellitus ([place]) ?On statin therapy and tolerating well. No myalgias. Does follow a healthy diet and has been walking at least 3 miles daily since February.  ? ?4. Hypertension associated with diabetes ([place]) ?Blood pressure has been well controlled on current regimen. Denies headaches, visual changes, weakness, confusion, chest pain, leg swelling, palpitations, or syncope.  ?  ? ? ?Relevant past medical, surgical, family, and social history reviewed and updated as indicated.  ?Allergies and medications reviewed and updated. Data reviewed: Chart in Epic. ? ? ?Past Medical History:  ?Diagnosis Date  ? Diabetes mellitus ([place])   ? Type 2 x 9 yrs.  ? Hyperlipidemia   ? Hypertension   ? x 5 yrs  ? ? ?Past Surgical History:  ?Procedure Laterality Date  ?  rt knee arthroscopy    ? BIOPSY  03/21/2020  ? Procedure: BIOPSY;  Surgeon: Harvel Quale, MD;  [facility];  Service: Gastroenterology;;  ? COLONOSCOPY  04/10/2012  ? Procedure: COLONOSCOPY;  Surgeon: Rogene Houston, MD;  [facility];  Service: Endoscopy;  Laterality: N/A;  1200  ? COLONOSCOPY WITH PROPOFOL N/A 03/21/2020  ? Procedure: COLONOSCOPY WITH PROPOFOL;  Surgeon: Harvel Quale, MD;  [facility];  Service: Gastroenterology;  Laterality: N/A;  10:00  ? POLYPECTOMY  03/21/2020  ? Procedure: POLYPECTOMY;  Surgeon: Harvel Quale, MD;  [facility];  Service: Gastroenterology;;  ? ? ?Social History  ? ?Socioeconomic History  ? Marital status: Married  ?  Spouse name: Not on file  ? Number of children: Not on file  ? Years of education: Not on file  ? Highest education level: Not on file  ?Occupational History  ? Not on file  ?Tobacco Use  ? Smoking status: Every Day  ? Smokeless tobacco: Never  ? Tobacco comments:  ?  Cigar a day  ?Vaping Use  ? Vaping Use: Never used  ?Substance and Sexual Activity  ? Alcohol use: Yes  ?  Comment: 6 pack a week  ? Drug use: No  ? Sexual activity: Not on file  ?Other Topics Concern  ? Not on file  ?Social History Narrative  ? 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 ?Social Determinants of Health  ? ?Financial Resource Strain: Not on file  ?Food Insecurity: Not on file  ?Transportation Needs: Not on file  ?Physical Activity: Not on file  ?Stress: Not on file  ?Social Connections: Not on file  ?Intimate Partner Violence: Not on file  ? ? ?Outpatient Encounter Medications  as of 10/30/2021  ?Medication Sig  ? amLODipine (NORVASC) 5 MG tablet Take 1 tablet (5 mg total) by mouth daily.  ? aspirin EC 81 MG tablet Take 81 mg by mouth daily. Swallow whole.  ? BD PEN NEEDLE NANO 2ND GEN 32G X 4 MM MISC USE TO CHECK BLOOD SUGAR DAILY  ? benazepril (LOTENSIN) 40 MG tablet Take 1 tablet (40 mg total) by mouth daily.  ? empagliflozin (JARDIANCE) 25 MG TABS tablet Take 1 tablet (25 mg total) by mouth daily before breakfast.  ? insulin detemir (LEVEMIR) 100 UNIT/ML FlexPen Inject 20 Units into the skin daily. (Patient taking differently: Inject 30  Units into the skin daily.)  ? levothyroxine (SYNTHROID) 175 MCG tablet TAKE 1 TABLET BY MOUTH EVERY DAY  ? Dulaglutide (TRULICITY) 3 XN/1.7GY SOPN Inject 3 mg as directed once a week.  ? [DISCONTINUED] Dulaglutide (TRULICITY) 3 FV/4.9SW SOPN Inject 3 mg as directed once a week.  ? [DISCONTINUED] pravastatin (PRAVACHOL) 20 MG tablet Take 1 tablet (20 mg total) by mouth daily.  ? [DISCONTINUED] TRULICITY 3 HQ/7.5FF SOPN INJECT 3 MG AS DIRECTED ONCE A WEEK. (Patient not taking: Reported on 10/30/2021)  ? ?No facility-administered encounter medications on file as of 10/30/2021.  ? ? ?Allergies  ?Allergen Reactions  ? Codeine   ?  Hives, itching ?  ? ? ?Review of Systems  ?Constitutional:  Negative for activity change, appetite change, chills, diaphoresis, fatigue, fever and unexpected weight change.  ?HENT: Negative.    ?Eyes: Negative.  Negative for photophobia and visual disturbance.  ?Respiratory:  Negative for cough, chest tightness and shortness of breath.   ?Cardiovascular:  Negative for chest pain, palpitations and leg swelling.  ?Gastrointestinal:  Negative for abdominal pain, blood in stool, constipation, diarrhea, nausea and vomiting.  ?Endocrine: Negative for cold intolerance, heat intolerance, polydipsia, polyphagia and polyuria.  ?Genitourinary:  Negative for decreased urine volume, difficulty urinating, dysuria, frequency and urgency.  ?Musculoskeletal:  Negative for arthralgias and myalgias.  ?Skin: Negative.   ?Allergic/Immunologic: Negative.   ?Neurological:  Negative for dizziness, tremors, seizures, syncope, facial asymmetry, speech difficulty, weakness, light-headedness, numbness and headaches.  ?Hematological: Negative.   ?Psychiatric/Behavioral:  Negative for confusion, hallucinations, sleep disturbance and suicidal ideas.   ?All other systems reviewed and are negative. ? ?   ? ?Objective:  ?BP 119/70   Pulse (!) 104   Temp 98.2 ?F (36.8 ?C)   Resp 18   Ht '5\' 5"'$  (1.651 m)   Wt 152 lb (68.9 kg)    SpO2 96%   BMI 25.29 kg/m?   ? ?Wt Readings from Last 3 Encounters:  ?10/30/21 152 lb (68.9 kg)  ?08/01/21 151 lb (68.5 kg)  ?05/01/21 148 lb (67.1 kg)  ? ? ?Physical Exam ?Vitals and nursing note reviewed.  ?Constitutional:   ?   General: He is not in acute distress. ?   Appearance: Normal appearance. He is well-developed, well-groomed and normal weight. He is not ill-appearing, toxic-appearing or diaphoretic.  ?HENT:  ?   Head: Normocephalic and atraumatic.  ?   Jaw: There is normal jaw occlusion.  ?   Right Ear: Hearing normal.  ?   Left Ear: Hearing normal.  ?   Nose: Nose normal.  ?   Mouth/Throat:  ?   Lips: Pink.  ?   Mouth: Mucous membranes are moist.  ?   Pharynx: Oropharynx is clear. Uvula midline.  ?Eyes:  ?   General: Lids are normal.  ?   Extraocular Movements: Extraocular  movements intact.  ?   Conjunctiva/sclera: Conjunctivae normal.  ?   Pupils: Pupils are equal, round, and reactive to light.  ?Neck:  ?   Thyroid: No thyroid mass, thyromegaly or thyroid tenderness.  ?   Vascular: No carotid bruit or JVD.  ?   Trachea: Trachea and phonation normal.  ?Cardiovascular:  ?   Rate and Rhythm: Normal rate and regular rhythm.  ?   Chest Wall: PMI is not displaced.  ?   Pulses: Normal pulses.  ?   Heart sounds: Normal heart sounds. No murmur heard. ?  No friction rub. No gallop.  ?Pulmonary:  ?   Effort: Pulmonary effort is normal. No respiratory distress.  ?   Breath sounds: Normal breath sounds. No wheezing.  ?Abdominal:  ?   General: Bowel sounds are normal. There is no distension or abdominal bruit.  ?   Palpations: Abdomen is soft. There is no hepatomegaly or splenomegaly.  ?   Tenderness: There is no abdominal tenderness. There is no right CVA tenderness or left CVA tenderness.  ?   Hernia: No hernia is present.  ?Musculoskeletal:     ?   General: Normal range of motion.  ?   Cervical back: Normal range of motion and neck supple.  ?   Right lower leg: No edema.  ?   Left lower leg: No edema.   ?Lymphadenopathy:  ?   Cervical: No cervical adenopathy.  ?Skin: ?   General: Skin is warm and dry.  ?   Capillary Refill: Capillary refill takes less than 2 seconds.  ?   Coloration: Skin is not cyanotic, jaundiced or pal

## 2021-10-31 LAB — CBC WITH DIFFERENTIAL/PLATELET
Basophils Absolute: 0.1 10*3/uL (ref 0.0–0.2)
Basos: 2 %
EOS (ABSOLUTE): 0.2 10*3/uL (ref 0.0–0.4)
Eos: 2 %
Hematocrit: 47.6 % (ref 37.5–51.0)
Hemoglobin: 15.7 g/dL (ref 13.0–17.7)
Immature Grans (Abs): 0 10*3/uL (ref 0.0–0.1)
Immature Granulocytes: 0 %
Lymphocytes Absolute: 2.1 10*3/uL (ref 0.7–3.1)
Lymphs: 32 %
MCH: 29.3 pg (ref 26.6–33.0)
MCHC: 33 g/dL (ref 31.5–35.7)
MCV: 89 fL (ref 79–97)
Monocytes Absolute: 0.5 10*3/uL (ref 0.1–0.9)
Monocytes: 8 %
Neutrophils Absolute: 3.7 10*3/uL (ref 1.4–7.0)
Neutrophils: 56 %
Platelets: 301 10*3/uL (ref 150–450)
RBC: 5.36 x10E6/uL (ref 4.14–5.80)
RDW: 13.1 % (ref 11.6–15.4)
WBC: 6.5 10*3/uL (ref 3.4–10.8)

## 2021-10-31 LAB — CMP14+EGFR
ALT: 22 IU/L (ref 0–44)
AST: 17 IU/L (ref 0–40)
Albumin/Globulin Ratio: 2 (ref 1.2–2.2)
Albumin: 4.9 g/dL (ref 3.8–4.9)
Alkaline Phosphatase: 118 IU/L (ref 44–121)
BUN/Creatinine Ratio: 14 (ref 9–20)
BUN: 14 mg/dL (ref 6–24)
Bilirubin Total: <0.2 mg/dL (ref 0.0–1.2)
CO2: 21 mmol/L (ref 20–29)
Calcium: 10.2 mg/dL (ref 8.7–10.2)
Chloride: 101 mmol/L (ref 96–106)
Creatinine, Ser: 1.03 mg/dL (ref 0.76–1.27)
Globulin, Total: 2.4 g/dL (ref 1.5–4.5)
Glucose: 256 mg/dL — ABNORMAL HIGH (ref 70–99)
Potassium: 4.6 mmol/L (ref 3.5–5.2)
Sodium: 139 mmol/L (ref 134–144)
Total Protein: 7.3 g/dL (ref 6.0–8.5)
eGFR: 88 mL/min/{1.73_m2} (ref >59)

## 2021-10-31 LAB — LIPID PANEL
Chol/HDL Ratio: 4.7 ratio (ref 0.0–5.0)
Cholesterol, Total: 234 mg/dL — ABNORMAL HIGH (ref 100–199)
HDL: 50 mg/dL (ref >39)
LDL Chol Calc (NIH): 141 mg/dL — ABNORMAL HIGH (ref 0–99)
Triglycerides: 239 mg/dL — ABNORMAL HIGH (ref 0–149)
VLDL Cholesterol Cal: 43 mg/dL — ABNORMAL HIGH (ref 5–40)

## 2021-10-31 LAB — THYROID PANEL WITH TSH
Free Thyroxine Index: 3.5 (ref 1.2–4.9)
T3 Uptake Ratio: 34 % (ref 24–39)
T4, Total: 10.2 ug/dL (ref 4.5–12.0)
TSH: 0.015 u[IU]/mL — ABNORMAL LOW (ref 0.450–4.500)

## 2021-10-31 MED ORDER — ATORVASTATIN CALCIUM 20 MG PO TABS: 20.0000 mg | ORAL_TABLET | Freq: Every day | ORAL | 3 refills | Status: DC

## 2021-10-31 NOTE — Addendum Note (Signed)
Addended by: Baruch Gouty on: 10/31/2021 01:35 PM ? ? Modules accepted: Orders ? ?

## 2021-11-14 ENCOUNTER — Encounter: Payer: Self-pay | Admitting: Emergency Medicine

## 2021-11-17 ENCOUNTER — Other Ambulatory Visit: Payer: Self-pay | Admitting: Family Medicine

## 2021-11-17 DIAGNOSIS — E039 Hypothyroidism, unspecified: Secondary | ICD-10-CM

## 2021-12-03 ENCOUNTER — Other Ambulatory Visit: Payer: Self-pay | Admitting: Family Medicine

## 2021-12-03 DIAGNOSIS — E039 Hypothyroidism, unspecified: Secondary | ICD-10-CM

## 2021-12-18 IMAGING — MR MR HEAD WO/W CM
16 of 18 series · 40 of 48 positions shown · IV contrast (gadavist)
Comparison: None.

CLINICAL DATA: Syncope.

EXAM:
MRI HEAD WITHOUT AND WITH CONTRAST
TECHNIQUE: Multiplanar, multiecho pulse sequences of the brain and surrounding
structures were obtained without and with intravenous contrast.
CONTRAST:  7mL GADAVIST GADOBUTROL 1 MMOL/ML IV SOLN

[Series 5: DWI · axial · 3.0mm · 0.88mm/px · z∈[-73,+62]mm · 2 of 46 slices shown (1 of 6)]
[im 1/46]
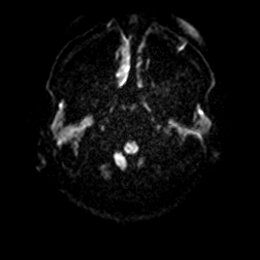
[im 46/46]
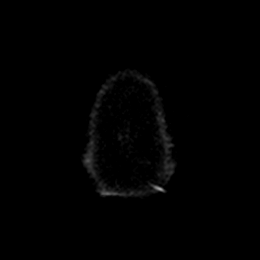

[Series 5: DWI · axial · 3.0mm · 0.88mm/px · z∈[-73,+62]mm · 2 of 46 slices shown (2 of 6)]
[im 1/46]
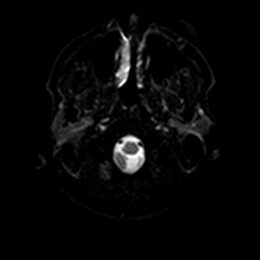
[im 46/46]
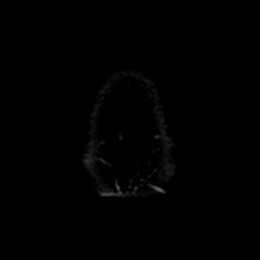

[Series 6: DWI · axial · 3.0mm · 0.88mm/px · z∈[-73,+62]mm · 3 of 45 slices shown (3 of 6)]
[im 1/45]
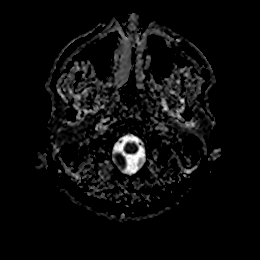
[im 23/45]
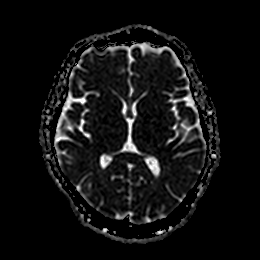
[im 45/45]
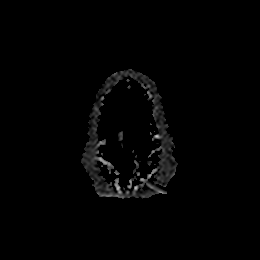

[Series 7: DWI · coronal · 4.0mm · 0.88mm/px · 2 of 34 slices shown (4 of 6)]
[im 1/34]
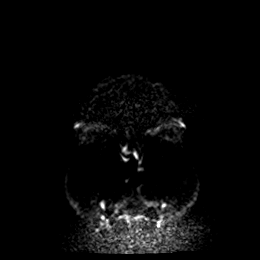
[im 34/34]
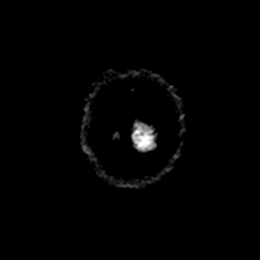

[Series 7: DWI · coronal · 4.0mm · 0.88mm/px · 2 of 34 slices shown (5 of 6)]
[im 1/34]
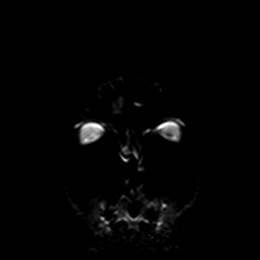
[im 34/34]
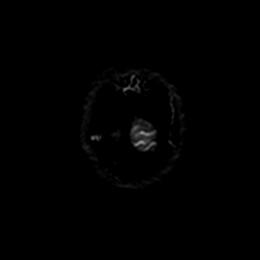

[Series 8: DWI · coronal · 4.0mm · 0.88mm/px · 2 of 34 slices shown (6 of 6)]
[im 1/34]
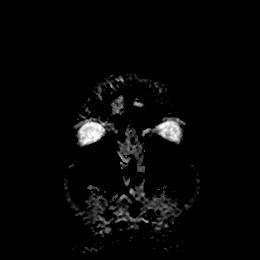
[im 34/34]
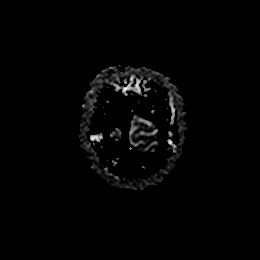

[Series 9: T1 · sagittal · 5.0mm · 0.75mm/px · 2 of 25 slices shown]
[im 1/25]
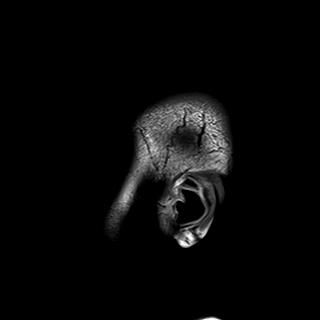
[im 25/25]
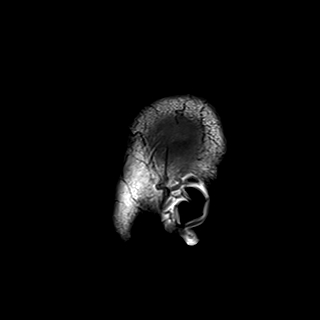

[Series 10: T2 · axial · 5.0mm · 0.72mm/px · z∈[-77,+66]mm · 2 of 25 slices shown]
[im 1/25]
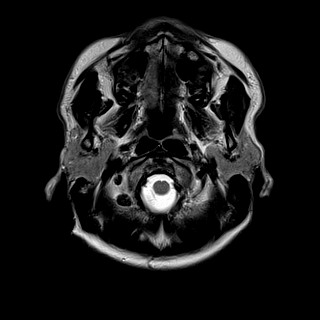
[im 25/25]
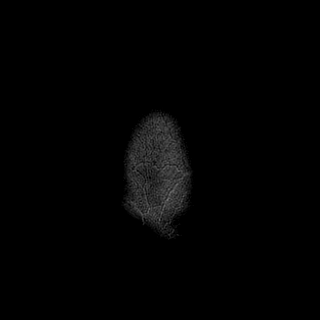

[Series 11: mag_images · axial · 3.0mm · 0.90mm/px · z∈[-95,+81]mm · 4 of 60 slices shown]
[im 1/60]
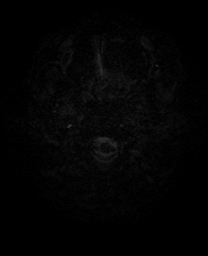
[im 20/60]
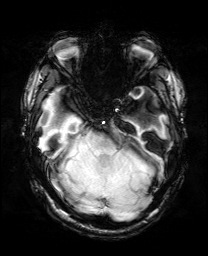
[im 40/60]
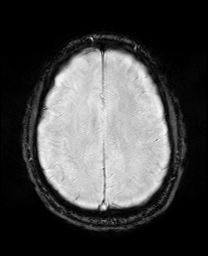
[im 60/60]
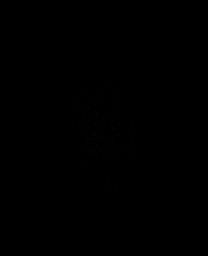

[Series 12: pha_images · axial · 3.0mm · 0.90mm/px · z∈[-95,+75]mm · 4 of 57 slices shown]
[im 1/57]
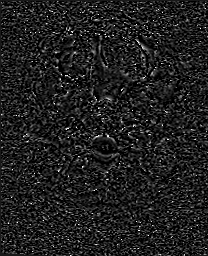
[im 19/57]
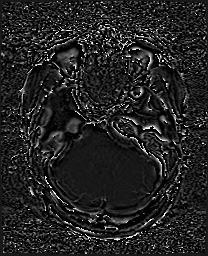
[im 38/57]
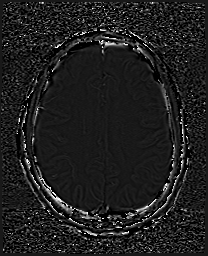
[im 57/57]
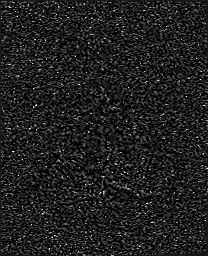

[Series 13: swi_images · axial · 3.0mm · 0.90mm/px · z∈[-95,+81]mm · 4 of 60 slices shown]
[im 1/60]
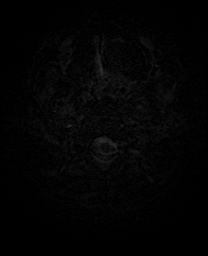
[im 20/60]
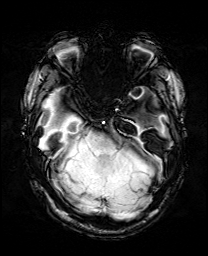
[im 40/60]
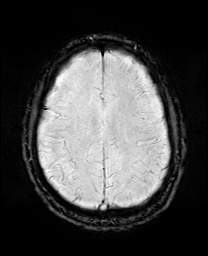
[im 60/60]
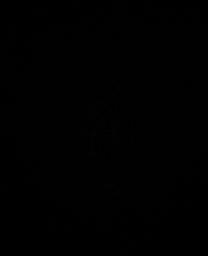

[Series 14: mip_images(sw) · axial · 24.0mm · 0.90mm/px · z∈[-85,+70]mm · 3 of 53 slices shown]
[im 1/53]
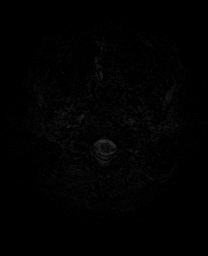
[im 27/53]
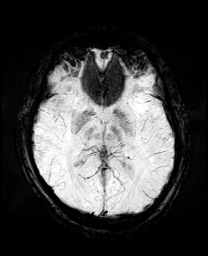
[im 53/53]
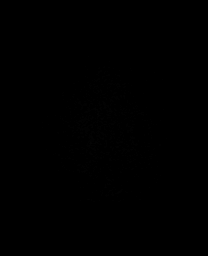

[Series 15: FLAIR · axial · 5.0mm · 0.45mm/px · z∈[-79,+64]mm · 2 of 25 slices shown]
[im 1/25]
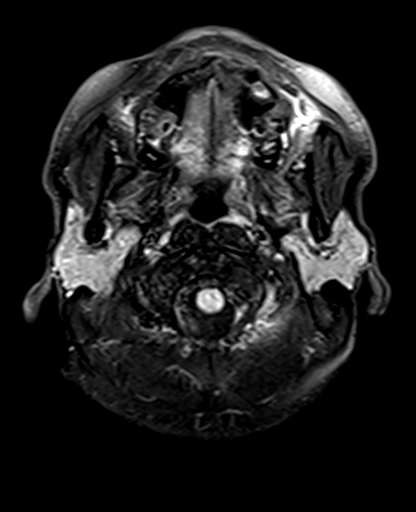
[im 25/25]
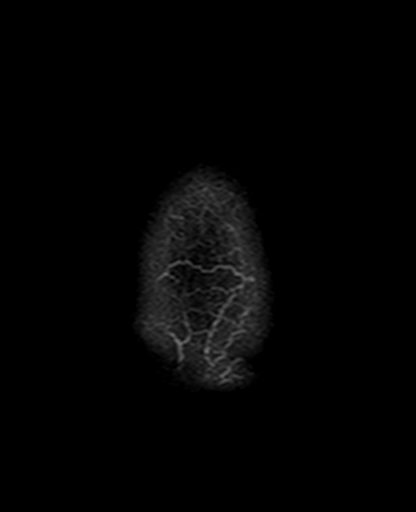

[Series 17: T2 post-contrast · coronal · 5.0mm · 0.72mm/px · 2 of 30 slices shown]
[im 1/30]
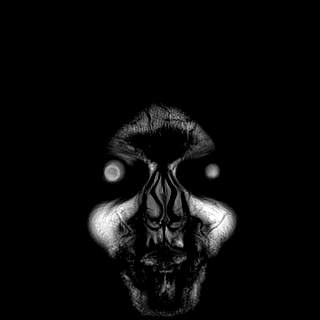
[im 30/30]
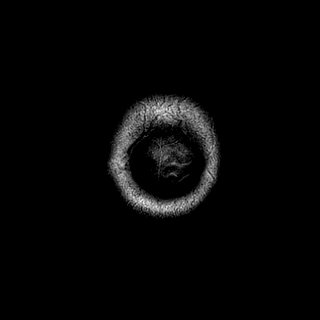

[Series 19: T1 post-contrast · coronal · 5.0mm · 0.34mm/px · 2 of 32 slices shown (1 of 2)]
[im 1/32]
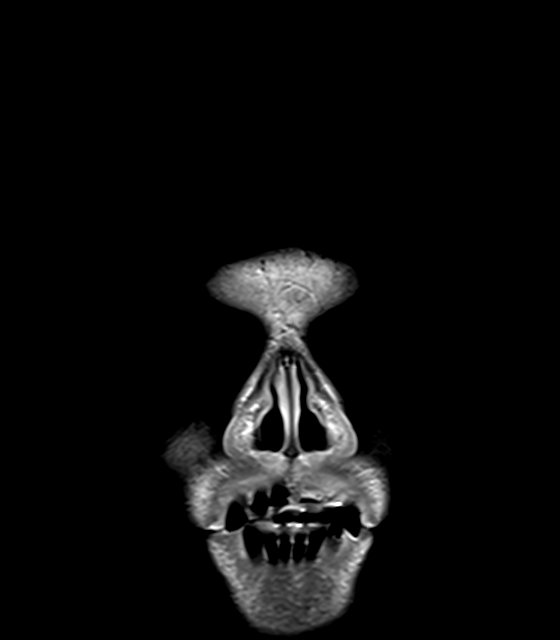
[im 32/32]
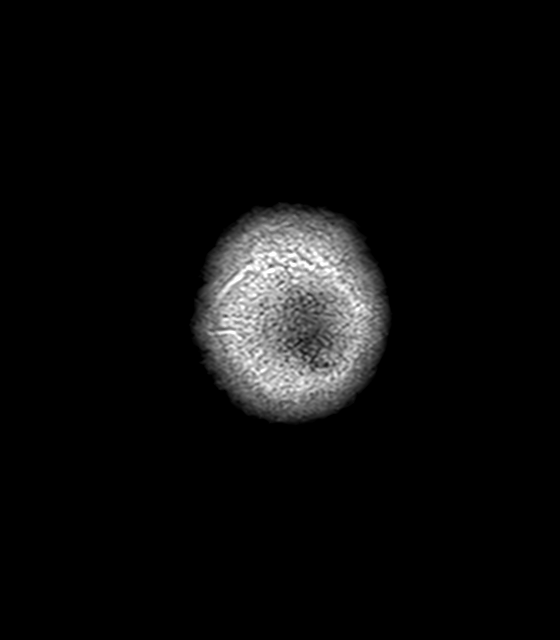

[Series 20: T1 post-contrast · sagittal · 5.0mm · 0.72mm/px · 2 of 25 slices shown (2 of 2)]
[im 1/25]
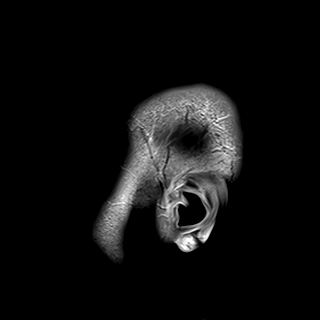
[im 25/25]
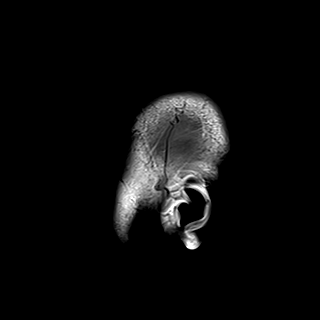

[40 of 48 positions shown; findings below may reference images not displayed]

FINDINGS: Brain: No focal parenchymal signal abnormality. No acute infarct or
intracranial hemorrhage. No midline shift, ventriculomegaly or
extra-axial fluid collection. No mass lesion. No abnormal
enhancement.

Vascular: Normal flow voids.

Skull and upper cervical spine: Normal marrow signal.

Sinuses/Orbits: Normal orbits. Clear paranasal sinuses. No mastoid
effusion.

Other: None.
IMPRESSION: Normal MRI brain.

## 2021-12-28 ENCOUNTER — Other Ambulatory Visit: Payer: Self-pay | Admitting: Family Medicine

## 2021-12-28 DIAGNOSIS — I152 Hypertension secondary to endocrine disorders: Secondary | ICD-10-CM

## 2022-01-24 ENCOUNTER — Encounter: Payer: Self-pay | Admitting: Family Medicine

## 2022-01-24 ENCOUNTER — Ambulatory Visit: Payer: 59 | Admitting: Family Medicine

## 2022-01-24 VITALS — BP 145/82 | HR 96 | Temp 98.4°F | Ht 65.0 in | Wt 150.0 lb

## 2022-01-24 DIAGNOSIS — E1159 Type 2 diabetes mellitus with other circulatory complications: Secondary | ICD-10-CM | POA: Diagnosis not present

## 2022-01-24 DIAGNOSIS — E1165 Type 2 diabetes mellitus with hyperglycemia: Secondary | ICD-10-CM

## 2022-01-24 DIAGNOSIS — E039 Hypothyroidism, unspecified: Secondary | ICD-10-CM

## 2022-01-24 DIAGNOSIS — E559 Vitamin D deficiency, unspecified: Secondary | ICD-10-CM

## 2022-01-24 DIAGNOSIS — E1169 Type 2 diabetes mellitus with other specified complication: Secondary | ICD-10-CM | POA: Insufficient documentation

## 2022-01-24 DIAGNOSIS — E785 Hyperlipidemia, unspecified: Secondary | ICD-10-CM

## 2022-01-24 DIAGNOSIS — N521 Erectile dysfunction due to diseases classified elsewhere: Secondary | ICD-10-CM

## 2022-01-24 DIAGNOSIS — I152 Hypertension secondary to endocrine disorders: Secondary | ICD-10-CM

## 2022-01-24 MED ORDER — SILDENAFIL CITRATE 25 MG PO TABS: 25.0000 mg | ORAL_TABLET | Freq: Every day | ORAL | 0 refills | Status: DC | PRN

## 2022-01-24 MED ORDER — LEVOTHYROXINE SODIUM 175 MCG PO TABS: 175.0000 ug | ORAL_TABLET | Freq: Every day | ORAL | 0 refills | Status: DC

## 2022-01-24 NOTE — Progress Notes (Signed)
Subjective:  Patient ID: Justin [name], male    DOB: [birth year], 50 y.o.   MRN: [id number]  Patient Care Team: Baruch Gouty, FNP as PCP - General (Family Medicine) Harl Bowie Alphonse Guild, MD as PCP - Cardiology (Cardiology) Celestia Khat, [place] (Optometry)   Chief Complaint:  Medical Management of Chronic Issues   HPI: Justin [name] is a 51 y.o. male presenting on 01/24/2022 for Medical Management of Chronic Issues   1. Uncontrolled type 2 diabetes mellitus with hyperglycemia ([facility]) Pt has not been checking blood sugars on a regular basis but feels it is high as he has polyuria and polydipsia. He is taking medications as prescribed without associated side effects. He does not follow a diet or exercise routine. He also reports issues getting and sustaining an erection. Denies this problem in the past. No other associated symptoms.   2. Hyperlipidemia associated with type 2 diabetes mellitus ([place]) Currently not taking statin therapy as prescribed. Does not follow a diet or exercise routine.   3. Hypertension associated with diabetes ([place]) On Norvasc and Lotensin. Tolerating well. No chest pain, palpitations, headaches, weakness, dizziness, confusion, visual changes, syncope, or leg swelling. Has started experiencing ED.   4. Hypothyroidism (acquired) Reports he has been out of his levothyroxine for 1 week. Aware of importance of calling for refills when out of medications. Last TSH was low and dosing was adjusted. States he feels well overall.   5. Vitamin D deficiency No taking Vit D repletion therapy. Denies arthralgias, difficulty walking, or recent fractures.     Relevant past medical, surgical, family, and social history reviewed and updated as indicated.  Allergies and medications reviewed and updated. Data reviewed: Chart in Epic.   Past Medical History:  Diagnosis Date   Diabetes mellitus ([place])    Type 2 x 9 yrs.   Hyperlipidemia    Hypertension    x 5 yrs    Past  Surgical History:  Procedure Laterality Date    rt knee arthroscopy     BIOPSY  03/21/2020   Procedure: BIOPSY;  Surgeon: Harvel Quale, MD;  [facility];  Service: Gastroenterology;;   COLONOSCOPY  04/10/2012   Procedure: COLONOSCOPY;  Surgeon: Rogene Houston, MD;  [facility];  Service: Endoscopy;  Laterality: N/A;  1200   COLONOSCOPY WITH PROPOFOL N/A 03/21/2020   Procedure: COLONOSCOPY WITH PROPOFOL;  Surgeon: Harvel Quale, MD;  [facility];  Service: Gastroenterology;  Laterality: N/A;  10:00   POLYPECTOMY  03/21/2020   Procedure: POLYPECTOMY;  Surgeon: Montez Morita, Quillian Quince, MD;  [facility];  Service: Gastroenterology;;    Social History   Socioeconomic History   Marital status: Married    Spouse name: Not on file   Number of children: Not on file   Years of education: Not on file   Highest education level: Not on file  Occupational History   Not on file  Tobacco Use   Smoking status: Every Day   Smokeless tobacco: Never   Tobacco comments:    Cigar a day  Vaping Use   Vaping Use: Never used  Substance and Sexual Activity   Alcohol use: Yes    Comment: 6 pack a week   Drug use: No   Sexual activity: Not on file  Other Topics Concern   Not on file  Social History Narrative   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   aspirin EC 81 MG tablet Take 81 mg by mouth daily. Swallow whole.   BD PEN NEEDLE NANO 2ND GEN 32G X 4 MM MISC USE TO CHECK BLOOD SUGAR DAILY    benazepril (LOTENSIN) 40 MG tablet Take 1 tablet (40 mg total) by mouth daily.   Dulaglutide (TRULICITY) 3 BM/8.4XL SOPN Inject 3 mg as directed once a week.   empagliflozin (JARDIANCE) 25 MG TABS tablet Take 1 tablet (25 mg total) by mouth daily before breakfast.   insulin detemir (LEVEMIR) 100 UNIT/ML FlexPen Inject 20 Units into the skin daily. (Patient taking differently: Inject 30 Units into the skin daily.)   sildenafil (VIAGRA) 25 MG tablet Take 1 tablet (25 mg total) by mouth daily as needed for erectile dysfunction.   [DISCONTINUED] levothyroxine (SYNTHROID) 175 MCG tablet TAKE 1 TABLET BY MOUTH EVERY DAY   levothyroxine (SYNTHROID) 175 MCG tablet Take 1 tablet (175 mcg total) by mouth daily.   [DISCONTINUED] atorvastatin (LIPITOR) 20 MG tablet Take 1 tablet (20 mg total) by mouth daily. (Patient not taking: Reported on 01/24/2022)   No facility-administered encounter medications on file as of 01/24/2022.    Allergies  Allergen Reactions   Codeine     Hives, itching     Review of Systems  Constitutional:  Negative for activity change, appetite change, chills, diaphoresis, fatigue and fever.  HENT: Negative.    Eyes: Negative.  Negative for photophobia and visual disturbance.  Respiratory:  Negative for cough, chest tightness and shortness of breath.   Cardiovascular:  Negative for chest pain, palpitations and leg swelling.  Gastrointestinal:  Negative for abdominal pain, blood in stool, constipation, diarrhea, nausea and vomiting.  Endocrine: Positive for polydipsia and polyuria. Negative for polyphagia.  Genitourinary:  Negative for decreased urine volume, difficulty urinating, dysuria, frequency, penile discharge, penile pain, penile swelling, scrotal swelling, testicular pain and urgency.       ED  Musculoskeletal:  Negative for arthralgias and myalgias.  Skin: Negative.   Allergic/Immunologic: Negative.   Neurological:  Negative for dizziness, tremors, seizures, syncope,  facial asymmetry, speech difficulty, weakness, light-headedness, numbness and headaches.  Hematological: Negative.   Psychiatric/Behavioral:  Negative for confusion, hallucinations, sleep disturbance and suicidal ideas.   All other systems reviewed and are negative.       Objective:  BP (!) 145/82   Pulse 96   Temp 98.4 F (36.9 C)   Ht $R'5\' 5"'sV$  (1.651 m)   Wt 150 lb (68 kg)   SpO2 98%   BMI 24.96 kg/m    Wt Readings from Last 3 Encounters:  01/24/22 150 lb (68 kg)  10/30/21 152 lb (68.9 kg)  08/01/21 151 lb (68.5 kg)    Physical Exam Vitals and nursing note reviewed.  Constitutional:      General: He is not in acute distress.    Appearance: Normal appearance. He is well-developed, well-groomed and normal weight. He is not ill-appearing, toxic-appearing or diaphoretic.  HENT:     Head: Normocephalic and atraumatic.     Jaw: There is normal jaw occlusion.  Right Ear: Hearing normal.     Left Ear: Hearing normal.     Nose: Nose normal.     Mouth/Throat:     Lips: Pink.     Mouth: Mucous membranes are moist.     Pharynx: Oropharynx is clear. Uvula midline.  Eyes:     General: Lids are normal.     Extraocular Movements: Extraocular movements intact.     Conjunctiva/sclera: Conjunctivae normal.     Pupils: Pupils are equal, round, and reactive to light.  Neck:     Thyroid: No thyroid mass, thyromegaly or thyroid tenderness.     Vascular: No carotid bruit or JVD.     Trachea: Trachea and phonation normal.  Cardiovascular:     Rate and Rhythm: Normal rate and regular rhythm.     Chest Wall: PMI is not displaced.     Pulses: Normal pulses.     Heart sounds: Normal heart sounds. No murmur heard.    No friction rub. No gallop.  Pulmonary:     Effort: Pulmonary effort is normal. No respiratory distress.     Breath sounds: Normal breath sounds. No wheezing.  Abdominal:     General: Bowel sounds are normal. There is no distension or abdominal bruit.     Palpations:  Abdomen is soft. There is no hepatomegaly or splenomegaly.     Tenderness: There is no abdominal tenderness. There is no right CVA tenderness or left CVA tenderness.     Hernia: No hernia is present.  Musculoskeletal:        General: Normal range of motion.     Cervical back: Normal range of motion and neck supple.     Right lower leg: No edema.     Left lower leg: No edema.  Lymphadenopathy:     Cervical: No cervical adenopathy.  Skin:    General: Skin is warm and dry.     Capillary Refill: Capillary refill takes less than 2 seconds.     Coloration: Skin is not cyanotic, jaundiced or pale.     Findings: No rash.  Neurological:     General: No focal deficit present.     Mental Status: He is alert and oriented to person, place, and time.     Sensory: Sensation is intact.     Motor: Motor function is intact.     Coordination: Coordination is intact.     Gait: Gait is intact.     Deep Tendon Reflexes: Reflexes are normal and symmetric.  Psychiatric:        Attention and Perception: Attention and perception normal.        Mood and Affect: Mood and affect normal.        Speech: Speech normal.        Behavior: Behavior normal. Behavior is cooperative.        Thought Content: Thought content normal.        Cognition and Memory: Cognition and memory normal.        Judgment: Judgment normal.     Results for orders placed or performed in visit on 11/27/21  HM DIABETES EYE EXAM  Result Value Ref Range   HM Diabetic Eye Exam No Retinopathy No Retinopathy       Pertinent labs & imaging results that were available during my care of the patient were reviewed by me and considered in my medical decision making.  Assessment & Plan:  [place] was seen today for medical management of chronic issues.  Diagnoses  and all orders for this visit:  Uncontrolled type 2 diabetes mellitus with hyperglycemia ([place]) Early for labs. Will obtain when due in 10 days. Diet and exercise encouraged. Will  adjust regimen if warranted.  -     Bayer DCA Hb A1c Waived; Future -     CMP14+EGFR; Future -     Lipid panel; Future -     Thyroid Panel With TSH; Future  Hyperlipidemia associated with type 2 diabetes mellitus ([place]) Not taking statin or following a diet and exercise routine. Will obtain labs. Statin therapy encouraged.  -     CMP14+EGFR; Future -     Lipid panel; Future  Hypertension associated with diabetes ([place]) BP fairly controlled. Changes were not made in regimen today. Goal BP is 130/80. Pt aware to report any persistent high or low readings. DASH diet and exercise encouraged. Exercise at least 150 minutes per week and increase as tolerated. Goal BMI > 25. Stress management encouraged. Avoid nicotine and tobacco product use. Avoid excessive alcohol and NSAID's. Avoid more than 2000 mg of sodium daily. Medications as prescribed. Follow up as scheduled.  -     Bayer DCA Hb A1c Waived; Future -     CMP14+EGFR; Future -     Lipid panel; Future -     Thyroid Panel With TSH; Future  Hypothyroidism (acquired) Aware to call office if out of medications. Will refill for 30 days. Labs pending, will adjust regimen further if warranted.  -     Thyroid Panel With TSH; Future -     levothyroxine (SYNTHROID) 175 MCG tablet; Take 1 tablet (175 mcg total) by mouth daily.  Vitamin D deficiency Not on repletion therapy. Will check labs and restart if warranted.  -     VITAMIN D 25 Hydroxy (Vit-D Deficiency, Fractures); Future  Erectile dysfunction due to type 2 diabetes mellitus ([place]) Will trail below. Safety precautions discussed in detail and medication information sheet provided. Pt to start with 25 mg to see if beneficial. Aware if this is not, can increase to 50 mg. Aware of red flags. Aware if BS and BP are better controlled this would likely not be an issue.  -     sildenafil (VIAGRA) 25 MG tablet; Take 1 tablet (25 mg total) by mouth daily as needed for erectile dysfunction.      Continue all other maintenance medications.  Follow up plan: Return in about 4 months (around 05/26/2022), or if symptoms worsen or fail to improve, for DM.   Continue healthy lifestyle choices, including diet (rich in fruits, vegetables, and lean proteins, and low in salt and simple carbohydrates) and exercise (at least 30 minutes of moderate physical activity daily).  Educational handout given for Viagra   The above assessment and management plan was discussed with the patient. The patient verbalized understanding of and has agreed to the management plan. Patient is aware to call the clinic if they develop any new symptoms or if symptoms persist or worsen. Patient is aware when to return to the clinic for a follow-up visit. Patient educated on when it is appropriate to go to the emergency department.   Monia Pouch, FNP-C [facility] [phone]

## 2022-01-24 NOTE — Patient Instructions (Signed)
[facility], set up account with email

## 2022-02-13 ENCOUNTER — Other Ambulatory Visit: Payer: Self-pay | Admitting: Family Medicine

## 2022-02-13 DIAGNOSIS — I152 Hypertension secondary to endocrine disorders: Secondary | ICD-10-CM

## 2022-02-14 ENCOUNTER — Other Ambulatory Visit: Payer: 59

## 2022-02-14 DIAGNOSIS — E1169 Type 2 diabetes mellitus with other specified complication: Secondary | ICD-10-CM

## 2022-02-14 DIAGNOSIS — E559 Vitamin D deficiency, unspecified: Secondary | ICD-10-CM

## 2022-02-14 DIAGNOSIS — I152 Hypertension secondary to endocrine disorders: Secondary | ICD-10-CM

## 2022-02-14 DIAGNOSIS — E039 Hypothyroidism, unspecified: Secondary | ICD-10-CM

## 2022-02-14 DIAGNOSIS — E1165 Type 2 diabetes mellitus with hyperglycemia: Secondary | ICD-10-CM

## 2022-02-14 LAB — BAYER DCA HB A1C WAIVED: HB A1C (BAYER DCA - WAIVED): 7.8 % — ABNORMAL HIGH (ref 4.8–5.6)

## 2022-02-15 LAB — CMP14+EGFR
ALT: 19 IU/L (ref 0–44)
AST: 19 IU/L (ref 0–40)
Albumin/Globulin Ratio: 1.9 (ref 1.2–2.2)
Albumin: 4.9 g/dL (ref 3.8–4.9)
Alkaline Phosphatase: 108 IU/L (ref 44–121)
BUN/Creatinine Ratio: 16 (ref 9–20)
BUN: 13 mg/dL (ref 6–24)
Bilirubin Total: 0.4 mg/dL (ref 0.0–1.2)
CO2: 20 mmol/L (ref 20–29)
Calcium: 9.9 mg/dL (ref 8.7–10.2)
Chloride: 103 mmol/L (ref 96–106)
Creatinine, Ser: 0.81 mg/dL (ref 0.76–1.27)
Globulin, Total: 2.6 g/dL (ref 1.5–4.5)
Glucose: 88 mg/dL (ref 70–99)
Potassium: 4.5 mmol/L (ref 3.5–5.2)
Sodium: 142 mmol/L (ref 134–144)
Total Protein: 7.5 g/dL (ref 6.0–8.5)
eGFR: 107 mL/min/{1.73_m2} (ref >59)

## 2022-02-15 LAB — LIPID PANEL
Chol/HDL Ratio: 3.5 ratio (ref 0.0–5.0)
Cholesterol, Total: 195 mg/dL (ref 100–199)
HDL: 56 mg/dL (ref >39)
LDL Chol Calc (NIH): 109 mg/dL — ABNORMAL HIGH (ref 0–99)
Triglycerides: 173 mg/dL — ABNORMAL HIGH (ref 0–149)
VLDL Cholesterol Cal: 30 mg/dL (ref 5–40)

## 2022-02-15 LAB — THYROID PANEL WITH TSH
Free Thyroxine Index: 3.5 (ref 1.2–4.9)
T3 Uptake Ratio: 33 % (ref 24–39)
T4, Total: 10.6 ug/dL (ref 4.5–12.0)
TSH: 0.081 u[IU]/mL — ABNORMAL LOW (ref 0.450–4.500)

## 2022-02-15 LAB — VITAMIN D 25 HYDROXY (VIT D DEFICIENCY, FRACTURES): Vit D, 25-Hydroxy: 30.2 ng/mL (ref 30.0–100.0)

## 2022-04-07 ENCOUNTER — Other Ambulatory Visit: Payer: Self-pay | Admitting: Family Medicine

## 2022-04-07 DIAGNOSIS — E1169 Type 2 diabetes mellitus with other specified complication: Secondary | ICD-10-CM

## 2022-04-08 MED ORDER — SILDENAFIL CITRATE 25 MG PO TABS: 25.0000 mg | ORAL_TABLET | Freq: Every day | ORAL | 0 refills | Status: DC | PRN

## 2022-04-09 ENCOUNTER — Telehealth: Payer: Self-pay | Admitting: Family Medicine

## 2022-04-09 DIAGNOSIS — E1165 Type 2 diabetes mellitus with hyperglycemia: Secondary | ICD-10-CM

## 2022-04-09 MED ORDER — INSULIN DETEMIR 100 UNIT/ML FLEXPEN: 30.0000 [IU] | PEN_INJECTOR | Freq: Every day | SUBCUTANEOUS | 11 refills | Status: DC

## 2022-04-09 NOTE — Telephone Encounter (Signed)
Levemir have been sent to [facility] in [place]

## 2022-04-09 NOTE — Telephone Encounter (Signed)
  Prescription Request  04/09/2022  Is this a "Controlled Substance" medicine? NO  Have you seen your PCP in the last 2 weeks? No pt has appt on 05/28/2022 for 4 month DM F/U  If YES, route message to pool  -  If NO, patient needs to be scheduled for appointment.  What is the name of the medication or equipment? insulin detemir (LEVEMIR) 100 UNIT/ML FlexPen BD PEN NEEDLE NANO 2ND GEN 32G X 4 MM MISC  Have you contacted your pharmacy to request a refill? yes   Which pharmacy would you like this sent to? [facility]   Patient notified that their request is being sent to the clinical staff for review and that they should receive a response within 2 business days.

## 2022-04-14 ENCOUNTER — Other Ambulatory Visit (INDEPENDENT_AMBULATORY_CARE_PROVIDER_SITE_OTHER): Payer: Self-pay | Admitting: Nurse Practitioner

## 2022-04-14 DIAGNOSIS — E118 Type 2 diabetes mellitus with unspecified complications: Secondary | ICD-10-CM

## 2022-04-29 ENCOUNTER — Other Ambulatory Visit: Payer: Self-pay | Admitting: Family Medicine

## 2022-04-29 DIAGNOSIS — E1159 Type 2 diabetes mellitus with other circulatory complications: Secondary | ICD-10-CM

## 2022-04-29 DIAGNOSIS — E1165 Type 2 diabetes mellitus with hyperglycemia: Secondary | ICD-10-CM

## 2022-04-30 ENCOUNTER — Other Ambulatory Visit: Payer: Self-pay | Admitting: Family Medicine

## 2022-04-30 DIAGNOSIS — E1165 Type 2 diabetes mellitus with hyperglycemia: Secondary | ICD-10-CM

## 2022-05-15 ENCOUNTER — Other Ambulatory Visit: Payer: Self-pay | Admitting: Family Medicine

## 2022-05-15 DIAGNOSIS — E1159 Type 2 diabetes mellitus with other circulatory complications: Secondary | ICD-10-CM

## 2022-05-20 ENCOUNTER — Other Ambulatory Visit: Payer: Self-pay | Admitting: *Deleted

## 2022-05-20 DIAGNOSIS — E1165 Type 2 diabetes mellitus with hyperglycemia: Secondary | ICD-10-CM

## 2022-05-20 MED ORDER — BD PEN NEEDLE NANO 2ND GEN 32G X 4 MM MISC: 3 refills | Status: DC

## 2022-05-28 ENCOUNTER — Ambulatory Visit: Payer: 59 | Admitting: Family Medicine

## 2022-05-28 ENCOUNTER — Encounter: Payer: Self-pay | Admitting: Family Medicine

## 2022-05-28 VITALS — BP 127/82 | HR 105 | Temp 98.5°F | Ht 65.0 in | Wt 148.9 lb

## 2022-05-28 DIAGNOSIS — E1169 Type 2 diabetes mellitus with other specified complication: Secondary | ICD-10-CM

## 2022-05-28 DIAGNOSIS — E1165 Type 2 diabetes mellitus with hyperglycemia: Secondary | ICD-10-CM

## 2022-05-28 DIAGNOSIS — E1159 Type 2 diabetes mellitus with other circulatory complications: Secondary | ICD-10-CM

## 2022-05-28 DIAGNOSIS — E039 Hypothyroidism, unspecified: Secondary | ICD-10-CM | POA: Diagnosis not present

## 2022-05-28 DIAGNOSIS — E785 Hyperlipidemia, unspecified: Secondary | ICD-10-CM

## 2022-05-28 DIAGNOSIS — I152 Hypertension secondary to endocrine disorders: Secondary | ICD-10-CM

## 2022-05-28 DIAGNOSIS — N521 Erectile dysfunction due to diseases classified elsewhere: Secondary | ICD-10-CM

## 2022-05-28 LAB — BAYER DCA HB A1C WAIVED: HB A1C (BAYER DCA - WAIVED): 9.7 % — ABNORMAL HIGH (ref 4.8–5.6)

## 2022-05-28 MED ORDER — INSULIN DETEMIR 100 UNIT/ML FLEXPEN: 40.0000 [IU] | PEN_INJECTOR | Freq: Every day | SUBCUTANEOUS | 11 refills | Status: DC

## 2022-05-28 MED ORDER — SILDENAFIL CITRATE 100 MG PO TABS: 50.0000 mg | ORAL_TABLET | Freq: Every day | ORAL | 11 refills | Status: DC | PRN

## 2022-05-28 NOTE — Progress Notes (Signed)
Subjective:  Patient ID: Justin [name], male    DOB: 1971-07-24, 51 y.o.   MRN: [id number]  Patient Care Team: Baruch Gouty, FNP as PCP - General (Family Medicine) Harl Bowie Alphonse Guild, MD as PCP - Cardiology (Cardiology) Celestia Khat, OD (Optometry)   Chief Complaint:  Medical Management of Chronic Issues   HPI: Justin [name] is a 51 y.o. male presenting on 05/28/2022 for Medical Management of Chronic Issues   1. Hypertension associated with diabetes ([place]) Complaint with meds - Yes Current Medications - lotensin and norvasc Checking BP at home - no Exercising Regularly - No Watching Salt intake - No Pertinent ROS:  Headache - No Fatigue - Yes Visual Disturbances - No Chest pain - No Dyspnea - No Palpitations - No LE edema - No They report good compliance with medications and can restate their regimen by memory. No medication side effects.  BP Readings from Last 3 Encounters:  05/28/22 127/82  01/24/22 (!) 145/82  10/30/21 119/70     2. Hyperlipidemia associated with type 2 diabetes mellitus ([place]) Compliant with medications - Yes Current medications - pravastatin Side effects from medications - No Diet - not healthy over last several months Exercise - no   3. Hypothyroidism (acquired) Compliant with medications - Yes Current medications - levothyroxine 175 mcg Adverse side effects - No Weight - stable  Bowel habit changes - No Heat or cold intolerance - No Mood changes - No Changes in sleep habits - No Fatigue - Yes Skin, hair, or nail changes - No Tremor - No Palpitations - No Edema - No Shortness of breath - No   4. Uncontrolled type 2 diabetes mellitus with hyperglycemia (HCC) Current diabetic medications include trulicity, jardiance, levemir Compliant with meds - Yes Current monitoring regimen: none Home blood sugar records:  not checking Any episodes of hypoglycemia? no Known diabetic complications: impotence and cardiovascular  disease Cardiovascular risk factors: diabetes mellitus, dyslipidemia, hypertension, and male gender Eye exam current (within one year): yes Podiatry yearly?  No Weight trend: stable Current diet:  not healthy over last few months Current exercise: no regular exercise Tdap Vaccine UTD?  No Urine microalbumin UTD? Yes Is He on ACE inhibitor or angiotensin II receptor blocker?  Yes, lotensin Is He on statin? Yes pravastatin Is He on ASA 81 mg daily?  Yes   5. Erectile dysfunction due to type 2 diabetes mellitus ([place]) Has improved slightly with Viagra but continues to have issues. No adverse reactions to the medication.      Relevant past medical, surgical, family, and social history reviewed and updated as indicated.  Allergies and medications reviewed and updated. Data reviewed: Chart in Epic.   Past Medical History:  Diagnosis Date   Diabetes mellitus ([place])    Type 2 x 9 yrs.   Hyperlipidemia    Hypertension    x 5 yrs    Past Surgical History:  Procedure Laterality Date    rt knee arthroscopy     BIOPSY  03/21/2020   Procedure: BIOPSY;  Surgeon: Harvel Quale, MD;  [facility];  Service: Gastroenterology;;   COLONOSCOPY  04/10/2012   Procedure: COLONOSCOPY;  Surgeon: Rogene Houston, MD;  Location: [facility];  Service: Endoscopy;  Laterality: N/A;  1200   COLONOSCOPY WITH PROPOFOL N/A 03/21/2020   Procedure: COLONOSCOPY WITH PROPOFOL;  Surgeon: Harvel Quale, MD;  [facility]: [facility];  Service: Gastroenterology;  Laterality: N/A;  10:00  POLYPECTOMY  03/21/2020   Procedure: POLYPECTOMY;  Surgeon: Montez Morita, Quillian Quince, MD;  Location: [facility];  Service: Gastroenterology;;    Social History   Socioeconomic History   Marital status: Married    Spouse name: Not on file   Number of children: Not on file   Years of education: Not on file   Highest education level: Not on file  Occupational History   Not on file   Tobacco Use   Smoking status: Every Day   Smokeless tobacco: Never   Tobacco comments:    Cigar a day  Vaping Use   Vaping Use: Never used  Substance and Sexual Activity   Alcohol use: Yes    Comment: 6 pack a week   Drug use: No   Sexual activity: Not on file  Other Topics Concern   Not on file  Social History Narrative   Married for 24 yrs.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 IT for [employer] Determinants of Health   Financial Resource Strain: Not on file  Food Insecurity: Not on file  Transportation Needs: Not on file  Physical Activity: Not on file  Stress: Not on file  Social Connections: Not on file  Intimate Partner Violence: Not on file    Outpatient Encounter Medications as of 05/28/2022  Medication Sig   amLODipine (NORVASC) 5 MG tablet TAKE 1 TABLET (5 MG TOTAL) BY MOUTH DAILY.   aspirin EC 81 MG tablet Take 81 mg by mouth daily. Swallow whole.   benazepril (LOTENSIN) 40 MG tablet TAKE 1 TABLET BY MOUTH EVERY DAY   insulin detemir (LEVEMIR) 100 UNIT/ML FlexPen Inject 30 Units into the skin daily.   Insulin Pen Needle (BD PEN NEEDLE NANO 2ND GEN) 32G X 4 MM MISC Use with insulin daily Dx E11.65   JARDIANCE 25 MG TABS tablet TAKE 1 TABLET BY MOUTH DAILY BEFORE BREAKFAST.   levothyroxine (SYNTHROID) 175 MCG tablet Take 1 tablet (175 mcg total) by mouth daily.   pravastatin (PRAVACHOL) 20 MG tablet Take 20 mg by mouth daily.   sildenafil (VIAGRA) 100 MG tablet Take 0.5-1 tablets (50-100 mg total) by mouth daily as needed for erectile dysfunction.   TRULICITY 3 XA/1.2IN SOPN INJECT 3 MG AS DIRECTED ONCE A WEEK   [DISCONTINUED] sildenafil (VIAGRA) 25 MG tablet Take 1 tablet (25 mg total) by mouth daily as needed for erectile dysfunction.   No facility-administered encounter medications on file as of 05/28/2022.    Allergies  Allergen Reactions   Codeine     Hives, itching     Review of Systems  Constitutional:  Positive for fatigue. Negative for activity  change, appetite change, chills, diaphoresis, fever and unexpected weight change.  HENT: Negative.    Eyes: Negative.  Negative for photophobia and visual disturbance.  Respiratory:  Negative for cough, chest tightness and shortness of breath.   Cardiovascular:  Negative for chest pain, palpitations and leg swelling.  Gastrointestinal:  Negative for abdominal pain, blood in stool, constipation, diarrhea, nausea and vomiting.  Endocrine: Negative.  Negative for polydipsia, polyphagia and polyuria.  Genitourinary:  Negative for decreased urine volume, difficulty urinating, dysuria, frequency and urgency.  Musculoskeletal:  Negative for arthralgias and myalgias.  Skin: Negative.   Allergic/Immunologic: Negative.   Neurological:  Negative for dizziness, tremors, seizures, syncope, facial asymmetry, speech difficulty, weakness, light-headedness, numbness and headaches.  Hematological: Negative.   Psychiatric/Behavioral:  Negative for confusion, hallucinations, sleep disturbance and suicidal ideas.   All other systems reviewed and  are negative.       Objective:  BP 127/82   Pulse (!) 105   Temp 98.5 F (36.9 C)   Ht _0  (1.651 m)   Wt 148 lb 14.4 oz (67.5 kg)   SpO2 96%   BMI 24.78 kg/m    Wt Readings from Last 3 Encounters:  05/28/22 148 lb 14.4 oz (67.5 kg)  01/24/22 150 lb (68 kg)  10/30/21 152 lb (68.9 kg)    Physical Exam Vitals and nursing note reviewed.  Constitutional:      General: He is not in acute distress.    Appearance: Normal appearance. He is not ill-appearing, toxic-appearing or diaphoretic.  HENT:     Head: Normocephalic and atraumatic.     Mouth/Throat:     Mouth: Mucous membranes are moist.  Eyes:     Extraocular Movements: Extraocular movements intact.     Conjunctiva/sclera: Conjunctivae normal.     Pupils: Pupils are equal, round, and reactive to light.  Cardiovascular:     Rate and Rhythm: Regular rhythm. Tachycardia present.     Pulses:           Dorsalis pedis pulses are 2+ on the right side and 2+ on the left side.       Posterior tibial pulses are 2+ on the right side and 2+ on the left side.     Heart sounds: No murmur heard.    No friction rub. No gallop.  Pulmonary:     Effort: Pulmonary effort is normal.     Breath sounds: Normal breath sounds.  Musculoskeletal:     Cervical back: Neck supple.     Right lower leg: No edema.     Left lower leg: No edema.  Feet:     Right foot:     Protective Sensation: 10 sites tested.  10 sites sensed.     Skin integrity: Skin integrity normal.     Toenail Condition: Right toenails are normal.     Left foot:     Protective Sensation: 10 sites tested.  10 sites sensed.     Skin integrity: Skin integrity normal.     Toenail Condition: Left toenails are normal.  Skin:    General: Skin is warm and dry.     Capillary Refill: Capillary refill takes less than 2 seconds.  Neurological:     General: No focal deficit present.     Mental Status: He is alert and oriented to person, place, and time.     Results for orders placed or performed in visit on 02/14/22  VITAMIN D 25 Hydroxy (Vit-D Deficiency, Fractures)  Result Value Ref Range   Vit D, 25-Hydroxy 30.2 30.0 - 100.0 ng/mL  Thyroid Panel With TSH  Result Value Ref Range   TSH 0.081 (L) 0.450 - 4.500 uIU/mL   T4, Total 10.6 4.5 - 12.0 ug/dL   T3 Uptake Ratio 33 24 - 39 %   Free Thyroxine Index 3.5 1.2 - 4.9  Lipid panel  Result Value Ref Range   Cholesterol, Total 195 100 - 199 mg/dL   Triglycerides 173 (H) 0 - 149 mg/dL   HDL 56 >39 mg/dL   VLDL Cholesterol Cal 30 5 - 40 mg/dL   LDL Chol Calc (NIH) 109 (H) 0 - 99 mg/dL   Chol/HDL Ratio 3.5 0.0 - 5.0 ratio  CMP14+EGFR  Result Value Ref Range   Glucose 88 70 - 99 mg/dL   BUN 13 6 - 24 mg/dL  Creatinine, Ser 0.81 0.76 - 1.27 mg/dL   eGFR 107 >59 mL/min/1.73   BUN/Creatinine Ratio 16 9 - 20   Sodium 142 134 - 144 mmol/L   Potassium 4.5 3.5 - 5.2 mmol/L   Chloride 103 96  - 106 mmol/L   CO2 20 20 - 29 mmol/L   Calcium 9.9 8.7 - 10.2 mg/dL   Total Protein 7.5 6.0 - 8.5 g/dL   Albumin 4.9 3.8 - 4.9 g/dL   Globulin, Total 2.6 1.5 - 4.5 g/dL   Albumin/Globulin Ratio 1.9 1.2 - 2.2   Bilirubin Total 0.4 0.0 - 1.2 mg/dL   Alkaline Phosphatase 108 44 - 121 IU/L   AST 19 0 - 40 IU/L   ALT 19 0 - 44 IU/L  Bayer DCA Hb A1c Waived  Result Value Ref Range   HB A1C (BAYER DCA - WAIVED) 7.8 (H) 4.8 - 5.6 %       Pertinent labs & imaging results that were available during my care of the patient were reviewed by me and considered in my medical decision making.  Assessment & Plan:  Eissa was seen today for medical management of chronic issues.  Diagnoses and all orders for this visit:  Uncontrolled type 2 diabetes mellitus with hyperglycemia (HCC) A1C 9.7. Diet and exercise discussed in detail. Aware to increase Levemir to 40 units. Follow up in 3 months.  -     Bayer DCA Hb A1c Waived -     Microalbumin / creatinine urine ratio  Hypertension associated with diabetes (HCC) BP well controlled. Changes were not made in regimen today. Goal BP is 130/80. Pt aware to report any persistent high or low readings. DASH diet and exercise encouraged. Exercise at least 150 minutes per week and increase as tolerated. Goal BMI > 25. Stress management encouraged. Avoid nicotine and tobacco product use. Avoid excessive alcohol and NSAID's. Avoid more than 2000 mg of sodium daily. Medications as prescribed. Follow up as scheduled.   Hyperlipidemia associated with type 2 diabetes mellitus ([place]) Diet encouraged - increase intake of fresh fruits and vegetables, increase intake of lean proteins. Bake, broil, or grill foods. Avoid fried, greasy, and fatty foods. Avoid fast foods. Increase intake of fiber-rich whole grains. Exercise encouraged - at least 150 minutes per week and advance as tolerated.  Goal BMI < 25. Continue medications as prescribed. Follow up in 3-6 months as discussed.   -     Lipid panel  Hypothyroidism (acquired) Thyroid disease has been fairly controlled. Labs are pending. Adjustments to regimen will be made if warranted. Make sure to take medications on an empty stomach with a full glass of water. Make sure to avoid vitamins or supplements for at least 4 hours before and 4 hours after taking medications. Repeat labs in 3 months if adjustments are made and in 6 months if stable.   -     Thyroid Panel With TSH  Erectile dysfunction due to type 2 diabetes mellitus ([place]) Some improvement with 25 mg of Viagra, would like to trial higher dosing. New prescription sent in today. Aware if diabetes is better controlled that this will improve also.  -     sildenafil (VIAGRA) 100 MG tablet; Take 0.5-1 tablets (50-100 mg total) by mouth daily as needed for erectile dysfunction.     Continue all other maintenance medications.  Follow up plan: Return in about 3 months (around 08/28/2022), or if symptoms worsen or fail to improve, for DM.   Continue healthy lifestyle  choices, including diet (rich in fruits, vegetables, and lean proteins, and low in salt and simple carbohydrates) and exercise (at least 30 minutes of moderate physical activity daily).  Educational handout given for DM  The above assessment and management plan was discussed with the patient. The patient verbalized understanding of and has agreed to the management plan. Patient is aware to call the clinic if they develop any new symptoms or if symptoms persist or worsen. Patient is aware when to return to the clinic for a follow-up visit. Patient educated on when it is appropriate to go to the emergency department.   Monia Pouch, FNP-C [facility] [phone]

## 2022-05-28 NOTE — Patient Instructions (Signed)

## 2022-05-29 LAB — THYROID PANEL WITH TSH
Free Thyroxine Index: 2.7 (ref 1.2–4.9)
T3 Uptake Ratio: 31 % (ref 24–39)
T4, Total: 8.7 ug/dL (ref 4.5–12.0)
TSH: <0.005 u[IU]/mL — ABNORMAL LOW (ref 0.450–4.500)

## 2022-05-29 LAB — LIPID PANEL
Chol/HDL Ratio: 4.7 ratio (ref 0.0–5.0)
Cholesterol, Total: 215 mg/dL — ABNORMAL HIGH (ref 100–199)
HDL: 46 mg/dL (ref >39)
LDL Chol Calc (NIH): 95 mg/dL (ref 0–99)
Triglycerides: 445 mg/dL — ABNORMAL HIGH (ref 0–149)
VLDL Cholesterol Cal: 74 mg/dL — ABNORMAL HIGH (ref 5–40)

## 2022-05-29 LAB — MICROALBUMIN / CREATININE URINE RATIO
Creatinine, Urine: 38.7 mg/dL
Microalb/Creat Ratio: 21 mg/g creat (ref 0–29)
Microalbumin, Urine: 8 ug/mL

## 2022-05-29 MED ORDER — LEVOTHYROXINE SODIUM 150 MCG PO TABS: 150.0000 ug | ORAL_TABLET | Freq: Every day | ORAL | 3 refills | Status: DC

## 2022-05-29 NOTE — Addendum Note (Signed)
Addended by: Baruch Gouty on: 05/29/2022 01:01 PM   Modules accepted: Orders

## 2022-05-29 NOTE — Addendum Note (Signed)
Addended by: Nigel Berthold C on: 05/29/2022 01:22 PM   Modules accepted: Orders

## 2022-05-30 ENCOUNTER — Other Ambulatory Visit: Payer: Self-pay | Admitting: Family Medicine

## 2022-05-30 DIAGNOSIS — E039 Hypothyroidism, unspecified: Secondary | ICD-10-CM

## 2022-06-03 ENCOUNTER — Other Ambulatory Visit: Payer: Self-pay

## 2022-06-03 ENCOUNTER — Telehealth: Payer: Self-pay

## 2022-06-03 NOTE — Telephone Encounter (Signed)
Levemir FlexPen 100UNIT/ML pen-injectors   Key: [id number] - Rx #: [id number]  Sent to plan

## 2022-06-10 NOTE — Telephone Encounter (Signed)
Outcome Shared Your PA request cannot be processed electronically. For further inquiries please contact the number on the back of the member prescription card. (Message [phone])

## 2022-06-10 NOTE — Telephone Encounter (Signed)
Re sent to plan per cover my meds that is what we are supposed to do

## 2022-06-10 NOTE — Telephone Encounter (Signed)
Called Cover my meds at [phone] They said to re do the PA on cover my meds and it will send a hard copy to them

## 2022-06-18 NOTE — Telephone Encounter (Signed)
Received fax from [place] stating PA is not required.

## 2022-07-30 ENCOUNTER — Other Ambulatory Visit: Payer: Self-pay | Admitting: Family Medicine

## 2022-07-30 DIAGNOSIS — E1165 Type 2 diabetes mellitus with hyperglycemia: Secondary | ICD-10-CM

## 2022-07-31 ENCOUNTER — Other Ambulatory Visit: Payer: Self-pay | Admitting: Family Medicine

## 2022-07-31 DIAGNOSIS — E1159 Type 2 diabetes mellitus with other circulatory complications: Secondary | ICD-10-CM

## 2022-08-01 ENCOUNTER — Other Ambulatory Visit: Payer: Self-pay | Admitting: Family Medicine

## 2022-08-01 DIAGNOSIS — E1165 Type 2 diabetes mellitus with hyperglycemia: Secondary | ICD-10-CM

## 2022-08-06 LAB — HM DIABETES EYE EXAM

## 2022-08-12 ENCOUNTER — Other Ambulatory Visit: Payer: Self-pay | Admitting: Family Medicine

## 2022-08-12 DIAGNOSIS — I152 Hypertension secondary to endocrine disorders: Secondary | ICD-10-CM

## 2022-09-10 ENCOUNTER — Ambulatory Visit: Payer: 59 | Admitting: Family Medicine

## 2022-09-10 ENCOUNTER — Encounter: Payer: Self-pay | Admitting: Family Medicine

## 2022-09-10 VITALS — BP 138/76 | HR 84 | Temp 98.4°F | Ht 65.0 in | Wt 154.0 lb

## 2022-09-10 DIAGNOSIS — E785 Hyperlipidemia, unspecified: Secondary | ICD-10-CM

## 2022-09-10 DIAGNOSIS — E039 Hypothyroidism, unspecified: Secondary | ICD-10-CM | POA: Diagnosis not present

## 2022-09-10 DIAGNOSIS — E1165 Type 2 diabetes mellitus with hyperglycemia: Secondary | ICD-10-CM

## 2022-09-10 DIAGNOSIS — E1169 Type 2 diabetes mellitus with other specified complication: Secondary | ICD-10-CM | POA: Diagnosis not present

## 2022-09-10 DIAGNOSIS — E1159 Type 2 diabetes mellitus with other circulatory complications: Secondary | ICD-10-CM | POA: Diagnosis not present

## 2022-09-10 DIAGNOSIS — I152 Hypertension secondary to endocrine disorders: Secondary | ICD-10-CM

## 2022-09-10 LAB — BAYER DCA HB A1C WAIVED: HB A1C (BAYER DCA - WAIVED): 10 % — ABNORMAL HIGH (ref 4.8–5.6)

## 2022-09-10 MED ORDER — TRULICITY 4.5 MG/0.5ML ~~LOC~~ SOAJ: 4.5000 mg | SUBCUTANEOUS | 6 refills | Status: AC

## 2022-09-10 MED ORDER — PRAVASTATIN SODIUM 20 MG PO TABS: 20.0000 mg | ORAL_TABLET | Freq: Every day | ORAL | 11 refills | Status: DC

## 2022-09-10 NOTE — Progress Notes (Signed)
Subjective:  Patient ID: Justin [name], male    DOB: February 11, 1971, 52 y.o.   MRN: [id number]  Patient Care Team: Baruch Gouty, FNP as PCP - General (Family Medicine) Harl Bowie Alphonse Guild, MD as PCP - Cardiology (Cardiology) Celestia Khat, [place] (Optometry) Celestia Khat, [place] (Optometry)   Chief Complaint:  Diabetes (3 month follow up /)   HPI: Justin [name] is a 52 y.o. male presenting on 09/10/2022 for Diabetes (3 month follow up /)   1. Hypertension associated with diabetes ([place]) Complaint with meds - Yes Current Medications - Norvasc, Lotensin Checking BP at home - No Exercising Regularly - No Watching Salt intake - No Pertinent ROS:  Headache - No Fatigue - No Visual Disturbances - No Chest pain - No Dyspnea - No Palpitations - No LE edema - No They report good compliance with medications and can restate their regimen by memory. No medication side effects.  BP Readings from Last 3 Encounters:  09/10/22 138/76  05/28/22 127/82  01/24/22 (!) 145/82     2. Hyperlipidemia associated with type 2 diabetes mellitus ([place]) Compliant with medications - No Current medications - Pravastatin prescribed but not taking Diet - has not been following a healthy diet Exercise - has not been exercising.   3. Uncontrolled type 2 diabetes mellitus with hyperglycemia ([place]) Has been taking medications as prescribed but has not been following a healthy diet or exercise routine. States over the last month BS has been better controlled but not at goal. No polyuria, polyphagia, or polydipsia.   4. Hypothyroidism (acquired) Compliant with medications - Yes Current medications - levothyroxine 150 mcg Adverse side effects - No Weight - stable  Bowel habit changes - No Heat or cold intolerance - No Mood changes - No Changes in sleep habits - No Fatigue - No Skin, hair, or nail changes - No Tremor - No Palpitations - No Edema - No Shortness of breath - No   Relevant past medical,  surgical, family, and social history reviewed and updated as indicated.  Allergies and medications reviewed and updated. Data reviewed: Chart in Epic.   Past Medical History:  Diagnosis Date   Diabetes mellitus ([place])    Type 2 x 9 yrs.   Hyperlipidemia    Hypertension    x 5 yrs    Past Surgical History:  Procedure Laterality Date    rt knee arthroscopy     BIOPSY  03/21/2020   Procedure: BIOPSY;  Surgeon: Harvel Quale, MD;  [facility];  Service: Gastroenterology;;   COLONOSCOPY  04/10/2012   Procedure: COLONOSCOPY;  Surgeon: Rogene Houston, MD;  [facility];  Service: Endoscopy;  Laterality: N/A;  1200   COLONOSCOPY WITH PROPOFOL N/A 03/21/2020   Procedure: COLONOSCOPY WITH PROPOFOL;  Surgeon: Harvel Quale, MD;  [facility];  Service: Gastroenterology;  Laterality: N/A;  10:00   POLYPECTOMY  03/21/2020   Procedure: POLYPECTOMY;  Surgeon: Harvel Quale, MD;  Location: [facility];  Service: Gastroenterology;;    Social History   Socioeconomic History   Marital status: Married    Spouse name: Not on file   Number of children: Not on file   Years of education: Not on file   Highest education level: Not on file  Occupational History   Not on file  Tobacco Use   Smoking status: Every Day   Smokeless tobacco: Never   Tobacco comments:    Cigar a  day  Vaping Use   Vaping Use: Never used  Substance and Sexual Activity   Alcohol use: Yes    Comment: 6 pack a week   Drug use: No   Sexual activity: Not on file  Other Topics Concern   Not on file  Social History Narrative   Married for 24 yrs.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   aspirin EC 81 MG tablet Take 81 mg by mouth daily. Swallow whole.   benazepril (LOTENSIN) 40 MG tablet TAKE 1 TABLET BY MOUTH EVERY DAY   Dulaglutide (TRULICITY) 4.5 0000000 SOPN Inject 4.5 mg as directed once a week for 4 doses.   insulin detemir (LEVEMIR) 100 UNIT/ML FlexPen Inject 40 Units into the skin daily.   Insulin Pen Needle (BD PEN NEEDLE NANO 2ND GEN) 32G X 4 MM MISC Use with insulin daily Dx E11.65   JARDIANCE 25 MG TABS tablet TAKE 1 TABLET BY MOUTH EVERY DAY BEFORE BREAKFAST   levothyroxine (SYNTHROID) 150 MCG tablet Take 1 tablet (150 mcg total) by mouth daily.   sildenafil (VIAGRA) 100 MG tablet Take 0.5-1 tablets (50-100 mg total) by mouth daily as needed for erectile dysfunction.   [DISCONTINUED] TRULICITY 3 0000000 SOPN INJECT 3 MG AS DIRECTED ONCE A WEEK   pravastatin (PRAVACHOL) 20 MG tablet Take 1 tablet (20 mg total) by mouth daily.   [DISCONTINUED] pravastatin (PRAVACHOL) 20 MG tablet Take 20 mg by mouth daily. (Patient not taking: Reported on 09/10/2022)   No facility-administered encounter medications on file as of 09/10/2022.    Allergies  Allergen Reactions   Codeine     Hives, itching     Review of Systems  Constitutional:  Negative for activity change, appetite change, chills, diaphoresis, fatigue, fever and unexpected weight change.  HENT: Negative.    Eyes: Negative.  Negative for photophobia.  Respiratory:  Negative for cough, chest tightness and shortness of breath.   Cardiovascular:  Negative for chest pain, palpitations and leg swelling.  Gastrointestinal:  Negative for abdominal pain, blood in stool, constipation, diarrhea, nausea and vomiting.  Endocrine: Negative.  Negative for cold intolerance, heat intolerance, polydipsia, polyphagia and polyuria.  Genitourinary:  Negative for decreased urine  volume, difficulty urinating, dysuria, frequency and urgency.  Musculoskeletal:  Negative for arthralgias and myalgias.  Skin: Negative.   Allergic/Immunologic: Negative.   Neurological:  Negative for dizziness, tremors, seizures, syncope, facial asymmetry, speech difficulty, weakness, light-headedness, numbness and headaches.  Hematological: Negative.   Psychiatric/Behavioral:  Negative for confusion, hallucinations, sleep disturbance and suicidal ideas.   All other systems reviewed and are negative.       Objective:  BP 138/76   Pulse 84   Temp 98.4 F (36.9 C) (Temporal)   Ht 5' 5"$  (1.651 m)   Wt 154 lb (69.9 kg)   SpO2 97%   BMI 25.63 kg/m    Wt Readings from Last 3 Encounters:  09/10/22 154 lb (69.9 kg)  05/28/22 148 lb 14.4 oz (67.5 kg)  01/24/22 150 lb (68 kg)  Physical Exam Vitals and nursing note reviewed.  Constitutional:      General: He is not in acute distress.    Appearance: Normal appearance. He is well-developed and well-groomed. He is not ill-appearing, toxic-appearing or diaphoretic.  HENT:     Head: Normocephalic and atraumatic.     Jaw: There is normal jaw occlusion.     Right Ear: Hearing normal.     Left Ear: Hearing normal.     Nose: Nose normal.     Mouth/Throat:     Lips: Pink.     Mouth: Mucous membranes are moist.     Pharynx: Oropharynx is clear. Uvula midline.  Eyes:     General: Lids are normal.     Extraocular Movements: Extraocular movements intact.     Conjunctiva/sclera: Conjunctivae normal.     Pupils: Pupils are equal, round, and reactive to light.  Neck:     Thyroid: No thyroid mass, thyromegaly or thyroid tenderness.     Vascular: No carotid bruit or JVD.     Trachea: Trachea and phonation normal.  Cardiovascular:     Rate and Rhythm: Normal rate and regular rhythm.     Chest Wall: PMI is not displaced.     Pulses: Normal pulses.     Heart sounds: Normal heart sounds. No murmur heard.    No friction rub. No gallop.   Pulmonary:     Effort: Pulmonary effort is normal. No respiratory distress.     Breath sounds: Normal breath sounds. No wheezing.  Abdominal:     General: Bowel sounds are normal. There is no distension or abdominal bruit.     Palpations: Abdomen is soft. There is no hepatomegaly or splenomegaly.     Tenderness: There is no abdominal tenderness. There is no right CVA tenderness or left CVA tenderness.     Hernia: No hernia is present.  Musculoskeletal:        General: Normal range of motion.     Cervical back: Normal range of motion and neck supple.     Right lower leg: No edema.     Left lower leg: No edema.  Lymphadenopathy:     Cervical: No cervical adenopathy.  Skin:    General: Skin is warm and dry.     Capillary Refill: Capillary refill takes less than 2 seconds.     Coloration: Skin is not cyanotic, jaundiced or pale.     Findings: No rash.  Neurological:     General: No focal deficit present.     Mental Status: He is alert and oriented to person, place, and time.     Sensory: Sensation is intact.     Motor: Motor function is intact.     Coordination: Coordination is intact.     Gait: Gait is intact.     Deep Tendon Reflexes: Reflexes are normal and symmetric.  Psychiatric:        Attention and Perception: Attention and perception normal.        Mood and Affect: Mood and affect normal.        Speech: Speech normal.        Behavior: Behavior normal. Behavior is cooperative.        Thought Content: Thought content normal.        Cognition and Memory: Cognition and memory normal.        Judgment: Judgment normal.     Results for orders placed or performed in visit on 05/30/22  HM DIABETES EYE EXAM  Result Value Ref Range   HM Diabetic Eye Exam No Retinopathy No Retinopathy       Pertinent labs & imaging results that were available during my care of the patient were reviewed by me and considered in my medical decision making.  Assessment & Plan:  Adonnis was seen  today for diabetes.  Diagnoses and all orders for this visit:  Uncontrolled type 2 diabetes mellitus with hyperglycemia ([place]) A1C 10 today. Discussed diet and lifestyle changes in detail. Will increase Trulicity to 4.5 mg weekly. Follow up in 3 months. If still not at goal, will refer to endocrinology.  -     Lipid panel -     Thyroid Panel With TSH -     Bayer DCA Hb A1c Waived -     Dulaglutide (TRULICITY) 4.5 0000000 SOPN; Inject 4.5 mg as directed once a week for 4 doses.  Hypertension associated with diabetes ([place]) BP fairly controlled. Changes were not made in regimen today. Goal BP is 130/80. Pt aware to report any persistent high or low readings. DASH diet and exercise encouraged. Exercise at least 150 minutes per week and increase as tolerated. Goal BMI > 25. Stress management encouraged. Avoid nicotine and tobacco product use. Avoid excessive alcohol and NSAID's. Avoid more than 2000 mg of sodium daily. Medications as prescribed. Follow up as scheduled.  -     Lipid panel -     Thyroid Panel With TSH  Hyperlipidemia associated with type 2 diabetes mellitus ([place]) Diet encouraged - increase intake of fresh fruits and vegetables, increase intake of lean proteins. Bake, broil, or grill foods. Avoid fried, greasy, and fatty foods. Avoid fast foods. Increase intake of fiber-rich whole grains. Exercise encouraged - at least 150 minutes per week and advance as tolerated.  Goal BMI < 25. Encouraged to restart medications. Follow up in 3-6 months as discussed.  -     Lipid panel -     pravastatin (PRAVACHOL) 20 MG tablet; Take 1 tablet (20 mg total) by mouth daily.  Hypothyroidism (acquired) Thyroid disease has been well controlled. Labs are pending. Adjustments to regimen will be made if warranted. Make sure to take medications on an empty stomach with a full glass of water. Make sure to avoid vitamins or supplements for at least 4 hours before and 4 hours after taking medications. Repeat  labs in 3 months if adjustments are made and in 6 months if stable.   -     Thyroid Panel With TSH     Continue all other maintenance medications.  Follow up plan: Return in about 3 months (around 12/09/2022) for CPE.   Continue healthy lifestyle choices, including diet (rich in fruits, vegetables, and lean proteins, and low in salt and simple carbohydrates) and exercise (at least 30 minutes of moderate physical activity daily).  Educational handout given for DM  The above assessment and management plan was discussed with the patient. The patient verbalized understanding of and has agreed to the management plan. Patient is aware to call the clinic if they develop any new symptoms or if symptoms persist or worsen. Patient is aware when to return to the clinic for a follow-up visit. Patient educated on when it is appropriate to go to the emergency department.   Monia Pouch, FNP-C [facility] [phone]

## 2022-09-10 NOTE — Patient Instructions (Signed)

## 2022-09-11 LAB — LIPID PANEL
Chol/HDL Ratio: 3.5 ratio (ref 0.0–5.0)
Cholesterol, Total: 190 mg/dL (ref 100–199)
HDL: 55 mg/dL (ref >39)
LDL Chol Calc (NIH): 112 mg/dL — ABNORMAL HIGH (ref 0–99)
Triglycerides: 132 mg/dL (ref 0–149)
VLDL Cholesterol Cal: 23 mg/dL (ref 5–40)

## 2022-09-11 LAB — THYROID PANEL WITH TSH
Free Thyroxine Index: 3.5 (ref 1.2–4.9)
T3 Uptake Ratio: 35 % (ref 24–39)
T4, Total: 10.1 ug/dL (ref 4.5–12.0)
TSH: 0.007 u[IU]/mL — ABNORMAL LOW (ref 0.450–4.500)

## 2022-09-24 ENCOUNTER — Telehealth: Payer: Self-pay

## 2022-09-24 NOTE — Telephone Encounter (Signed)
[facility] (Key: [id number]) Rx #[id number] Need Help? Call us at [phone] Status sent iconSent to Plan today Drug Trulicity '3MG'$ /0.5ML pen-injectors ePA cloud Child psychotherapist Electronic PA Form [id number] NCPDP)

## 2022-09-26 NOTE — Telephone Encounter (Signed)
[facility] (Key: [id number]) Rx #[id number] Need Help? Call us at [phone] Archived today Outcome N/A Your PA has been resolved, no additional PA is required. For further inquiries please contact the number on the back of the member prescription card. (Message [id number]) Drug Trulicity '3MG'$ /0.5ML pen-injectors ePA cloud Social research officer, government PA Form [phone] NCPDP) Original Claim Info 76 MAX QTY OF 6.000 IN 063 DAYSQUANTITY REMAINING .000NEXT FILL DT [id number], LAST FILL [id number] [facility]  [facility]Y [facility],PH# [id number] HELP DESK [phone])  Pharmacy informed

## 2022-09-27 ENCOUNTER — Other Ambulatory Visit (HOSPITAL_COMMUNITY): Payer: Self-pay

## 2022-09-27 ENCOUNTER — Other Ambulatory Visit: Payer: Self-pay | Admitting: Family Medicine

## 2022-09-27 DIAGNOSIS — E1159 Type 2 diabetes mellitus with other circulatory complications: Secondary | ICD-10-CM

## 2022-09-27 DIAGNOSIS — E1165 Type 2 diabetes mellitus with hyperglycemia: Secondary | ICD-10-CM

## 2022-09-27 NOTE — Telephone Encounter (Signed)
Ran test claim, came back refill too soon. Last fill date 09/20/22, next fill date 10/01/22. Max quantity 6 in 63 days.

## 2022-10-07 ENCOUNTER — Telehealth: Payer: Self-pay | Admitting: Family Medicine

## 2022-10-07 ENCOUNTER — Other Ambulatory Visit: Payer: Self-pay | Admitting: Family Medicine

## 2022-10-07 DIAGNOSIS — E1165 Type 2 diabetes mellitus with hyperglycemia: Secondary | ICD-10-CM

## 2022-10-07 MED ORDER — LANTUS SOLOSTAR 100 UNIT/ML ~~LOC~~ SOPN: 32.0000 [IU] | PEN_INJECTOR | Freq: Every day | SUBCUTANEOUS | 99 refills | Status: DC

## 2022-10-07 NOTE — Telephone Encounter (Signed)
Pt informed of provider feedback and he repeated instructions back to me. Advised if he is unsure to call us back put pt voiced understanding and said he understood.

## 2022-10-07 NOTE — Telephone Encounter (Signed)
Spoke with patient, his insurance is no longer covering his Levemir and he has been out for almost two weeks.  He is currently taking Trulicity 4.5 mg weekly and Jardiance 25 mg daily.  He needs something sent in to replace his Levemir.  His A1C was 10.0 on 09/10/22 and he had an office visit that same day.  He said his insurance company will cover the following and he needs the prescription sent to [facility].  Lantus Fiasp Humulin RU-500 Novolin 70/30 Novolin-N Novolin-R Novolog [place]

## 2022-10-07 NOTE — Telephone Encounter (Signed)
Please inform patient that I have sent in Lantus to replace the Levemir.  I am going to start him on a slightly lower dose than what he has been injecting with Levemir just to make sure that he does not have any low blood sugars as this medication does act a little longer than the Levemir in the body.  He will start with 32 units at bedtime.  He may increase by 1 unit every 2 days if his morning fasting blood sugar is above 150 for 2 days in a row.  Please review this example with him.  Make sure he has close follow up scheduled with PCP  If your FASTING blood sugar is above 150 for 2 consecutive days, increase by 1 unit of your long acting insulin.  Example: Day 1: Blood sugar was 159 Day 2: Blood sugar was 205  Increase Lantus to 33 units Day 3: Blood sugar is 202 Day 4: Blood sugars 168  Increase Lantus to 34 units Day 5: Blood sugar is 105 Day 6: Blood sugars 145  Stick with 34 units.  Do not increase.

## 2022-10-07 NOTE — Telephone Encounter (Signed)
Patient needs an alternative called in for insulin detemir (LEVEMIR) 100 UNIT/ML FlexPen because insurance no longer covers. [facility] [place]

## 2023-01-09 ENCOUNTER — Encounter: Payer: Self-pay | Admitting: Family Medicine

## 2023-01-09 ENCOUNTER — Ambulatory Visit (INDEPENDENT_AMBULATORY_CARE_PROVIDER_SITE_OTHER): Payer: 59 | Admitting: Family Medicine

## 2023-01-09 VITALS — BP 141/94 | HR 101 | Temp 97.9°F | Ht 65.0 in | Wt 157.8 lb

## 2023-01-09 DIAGNOSIS — I152 Hypertension secondary to endocrine disorders: Secondary | ICD-10-CM

## 2023-01-09 DIAGNOSIS — E559 Vitamin D deficiency, unspecified: Secondary | ICD-10-CM

## 2023-01-09 DIAGNOSIS — Z0000 Encounter for general adult medical examination without abnormal findings: Secondary | ICD-10-CM

## 2023-01-09 DIAGNOSIS — Z0001 Encounter for general adult medical examination with abnormal findings: Secondary | ICD-10-CM | POA: Diagnosis not present

## 2023-01-09 DIAGNOSIS — Z7984 Long term (current) use of oral hypoglycemic drugs: Secondary | ICD-10-CM

## 2023-01-09 DIAGNOSIS — E1169 Type 2 diabetes mellitus with other specified complication: Secondary | ICD-10-CM

## 2023-01-09 DIAGNOSIS — E1159 Type 2 diabetes mellitus with other circulatory complications: Secondary | ICD-10-CM | POA: Diagnosis not present

## 2023-01-09 DIAGNOSIS — E1165 Type 2 diabetes mellitus with hyperglycemia: Secondary | ICD-10-CM | POA: Diagnosis not present

## 2023-01-09 DIAGNOSIS — E039 Hypothyroidism, unspecified: Secondary | ICD-10-CM | POA: Diagnosis not present

## 2023-01-09 DIAGNOSIS — E785 Hyperlipidemia, unspecified: Secondary | ICD-10-CM

## 2023-01-09 LAB — BAYER DCA HB A1C WAIVED: HB A1C (BAYER DCA - WAIVED): 9.7 % — ABNORMAL HIGH (ref 4.8–5.6)

## 2023-01-09 MED ORDER — TRULICITY 4.5 MG/0.5ML ~~LOC~~ SOPN
4.5000 mg | PEN_INJECTOR | SUBCUTANEOUS | 6 refills | Status: AC
Start: 2023-01-09 — End: 2023-04-09

## 2023-01-09 MED ORDER — AMLODIPINE BESYLATE 5 MG PO TABS: 5.0000 mg | ORAL_TABLET | Freq: Every day | ORAL | 1 refills | Status: DC

## 2023-01-09 MED ORDER — BENAZEPRIL HCL 40 MG PO TABS: 40.0000 mg | ORAL_TABLET | Freq: Every day | ORAL | 1 refills | Status: DC

## 2023-01-09 MED ORDER — EMPAGLIFLOZIN 25 MG PO TABS: ORAL_TABLET | ORAL | 1 refills | Status: DC

## 2023-01-09 NOTE — Patient Instructions (Signed)

## 2023-01-09 NOTE — Progress Notes (Signed)
Subjective:  Patient ID: Justin [name], male    DOB: 1970/07/30, 52 y.o.   MRN: [id number]  Patient Care Team: Sonny Masters, FNP as PCP - General (Family Medicine) Wyline Mood Dorothe Pea, MD as PCP - Cardiology (Cardiology) Delora Fuel, OD (Optometry) Delora Fuel, [place] (Optometry)   Chief Complaint:  Annual Exam   HPI: Justin [name] is a 52 y.o. male presenting on 01/09/2023 for Annual Exam   1. Annual physical exam Health maintenance discussed in detail. Pt has been doing fairly well. Has been out of his Trulicity over the last 2 months and blood sugars have been running high. He has been taking all other medications as prescribed. His eye exam is up to date and he does see the dentist on a regular basis. No complaints today.      Relevant past medical, surgical, family, and social history reviewed and updated as indicated.  Allergies and medications reviewed and updated. Data reviewed: Chart in Epic.   Past Medical History:  Diagnosis Date   Diabetes mellitus (HCC)    Type 2 x 9 yrs.   Hyperlipidemia    Hypertension    x 5 yrs    Past Surgical History:  Procedure Laterality Date    rt knee arthroscopy     BIOPSY  03/21/2020   Procedure: BIOPSY;  Surgeon: Dolores Frame, MD;  Location: [facility];  Service: Gastroenterology;;   COLONOSCOPY  04/10/2012   Procedure: COLONOSCOPY;  Surgeon: Malissa Hippo, MD;  Location: [facility];  Service: Endoscopy;  Laterality: N/A;  1200   COLONOSCOPY WITH PROPOFOL N/A 03/21/2020   Procedure: COLONOSCOPY WITH PROPOFOL;  Surgeon: Dolores Frame, MD;  Location: [facility];  Service: Gastroenterology;  Laterality: N/A;  10:00   POLYPECTOMY  03/21/2020   Procedure: POLYPECTOMY;  Surgeon: Marguerita Merles, Reuel Boom, MD;  Location: [facility];  Service: Gastroenterology;;    Social History   Socioeconomic History   Marital status: Married    Spouse name: Not on file   Number of children: Not on  file   Years of education: Not on file   Highest education level: Not on file  Occupational History   Not on file  Tobacco Use   Smoking status: Every Day   Smokeless tobacco: Never   Tobacco comments:    Cigar a day  Vaping Use   Vaping Use: Never used  Substance and Sexual Activity   Alcohol use: Yes    Comment: 6 pack a week   Drug use: No   Sexual activity: Not on file  Other Topics Concern   Not on file  Social History Narrative   Married for 24 yrs.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 Swallow whole.   Dulaglutide (TRULICITY) 4.5 MG/0.5ML SOPN Inject 4.5 mg as directed once a week.   Insulin Pen Needle (BD PEN NEEDLE NANO 2ND GEN) 32G X 4 MM MISC Use with insulin daily Dx E11.65   levothyroxine (SYNTHROID) 150 MCG tablet Take 1 tablet (150 mcg  total) by mouth daily.   pravastatin (PRAVACHOL) 20 MG tablet Take 1 tablet (20 mg total) by mouth daily.   sildenafil (VIAGRA) 100 MG tablet Take 0.5-1 tablets (50-100 mg total) by mouth daily as needed for erectile dysfunction.   [DISCONTINUED] amLODipine (NORVASC) 5 MG tablet TAKE 1 TABLET (5 MG TOTAL) BY MOUTH DAILY.   [DISCONTINUED] benazepril (LOTENSIN) 40 MG tablet TAKE 1 TABLET BY MOUTH EVERY DAY   [DISCONTINUED] empagliflozin (JARDIANCE) 25 MG TABS tablet TAKE 1 TABLET BY MOUTH EVERY DAY BEFORE BREAKFAST   amLODipine (NORVASC) 5 MG tablet Take 1 tablet (5 mg total) by mouth daily.   benazepril (LOTENSIN) 40 MG tablet Take 1 tablet (40 mg total) by mouth daily.   empagliflozin (JARDIANCE) 25 MG TABS tablet TAKE 1 TABLET BY MOUTH EVERY  DAY BEFORE BREAKFAST   insulin glargine (LANTUS SOLOSTAR) 100 UNIT/ML Solostar Pen Inject 32-40 Units into the skin at bedtime. Start with 32u at bedtime. Increase by 1 unit every TWO days if MORNING FASTING sugar above 150.   TRULICITY 4.5 MG/0.5ML SOPN SMARTSIG:4.5 Milligram(s) SUB-Q Once a Week (Patient not taking: Reported on 01/09/2023)   No facility-administered encounter medications on file as of 01/09/2023.    Allergies  Allergen Reactions   Codeine     Hives, itching     Review of Systems  Constitutional:  Negative for activity change, appetite change, chills, diaphoresis, fatigue, fever and unexpected weight change.  HENT: Negative.    Eyes: Negative.  Negative for photophobia and visual disturbance.  Respiratory:  Negative for cough, chest tightness and shortness of breath.   Cardiovascular:  Negative for chest pain, palpitations and leg swelling.  Gastrointestinal:  Negative for abdominal pain, blood in stool, constipation, diarrhea, nausea and vomiting.  Endocrine: Positive for polydipsia, polyphagia and polyuria. Negative for cold intolerance and heat intolerance.  Genitourinary:  Negative for decreased urine volume, difficulty urinating, dysuria, frequency and urgency.  Musculoskeletal:  Negative for arthralgias and myalgias.  Skin: Negative.   Allergic/Immunologic: Negative.   Neurological:  Negative for dizziness, tremors, seizures, syncope, facial asymmetry, speech difficulty, weakness, light-headedness, numbness and headaches.  Hematological: Negative.   Psychiatric/Behavioral:  Negative for confusion, hallucinations, sleep disturbance and suicidal ideas.   All other systems reviewed and are negative.       Objective:  BP (!) 141/94   Pulse (!) 101   Temp 97.9 F (36.6 C) (Temporal)   Ht 5\' 5"  (1.651 m)   Wt 157 lb 12.8 oz (71.6 kg)   SpO2 96%   BMI 26.26 kg/m    Wt Readings from Last 3 Encounters:  01/09/23 157 lb 12.8 oz (71.6 kg)  09/10/22 154 lb  (69.9 kg)  05/28/22 148 lb 14.4 oz (67.5 kg)    Physical Exam Vitals and nursing note reviewed.  Constitutional:      General: He is not in acute distress.    Appearance: Normal appearance. He is well-developed, well-groomed and overweight. He is not ill-appearing, toxic-appearing or diaphoretic.  HENT:     Head: Normocephalic and atraumatic.     Jaw: There is normal jaw occlusion.     Right Ear: Hearing, tympanic membrane, ear canal and external ear normal.     Left Ear: Hearing, tympanic membrane, ear canal and external ear normal.     Nose: Nose normal.     Mouth/Throat:     Lips: Pink.     Mouth: Mucous membranes are moist.     Pharynx: Oropharynx is clear. Uvula midline.  Eyes:     General: Lids are normal.     Extraocular Movements: Extraocular movements intact.     Conjunctiva/sclera: Conjunctivae normal.     Pupils: Pupils are equal, round, and reactive to light.  Neck:     Thyroid: No thyroid mass, thyromegaly or thyroid tenderness.     Vascular: No carotid bruit or JVD.     Trachea: Trachea and phonation normal.  Cardiovascular:     Rate and Rhythm: Normal rate and regular rhythm.     Chest Wall: PMI is not displaced.     Pulses: Normal pulses.     Heart sounds: Normal heart sounds. No murmur heard.    No friction rub. No gallop.  Pulmonary:     Effort: Pulmonary effort is normal. No respiratory distress.     Breath sounds: Normal breath sounds. No wheezing.  Abdominal:     General: Bowel sounds are normal. There is no distension or abdominal bruit.     Palpations: Abdomen is soft. There is no hepatomegaly or splenomegaly.     Tenderness: There is no abdominal tenderness. There is no right CVA tenderness or left CVA tenderness.     Hernia: No hernia is present.  Musculoskeletal:        General: Normal range of motion.     Cervical back: Normal range of motion and neck supple.     Right lower leg: No edema.     Left lower leg: No edema.  Lymphadenopathy:      Cervical: No cervical adenopathy.  Skin:    General: Skin is warm and dry.     Capillary Refill: Capillary refill takes less than 2 seconds.     Coloration: Skin is not cyanotic, jaundiced or pale.     Findings: No rash.  Neurological:     General: No focal deficit present.     Mental Status: He is alert and oriented to person, place, and time.     Cranial Nerves: No cranial nerve deficit.     Sensory: Sensation is intact. No sensory deficit.     Motor: Motor function is intact. No weakness.     Coordination: Coordination is intact. Coordination normal.     Gait: Gait is intact. Gait normal.     Deep Tendon Reflexes: Reflexes are normal and symmetric. Reflexes normal.  Psychiatric:        Attention and Perception: Attention and perception normal.        Mood and Affect: Mood and affect normal.        Speech: Speech normal.        Behavior: Behavior normal. Behavior is cooperative.        Thought Content: Thought content normal.        Cognition and Memory: Cognition and memory normal.        Judgment: Judgment normal.     Results for orders placed or performed in visit on 09/24/22  [facility] DIABETES EYE EXAM  Result Value Ref Range   [facility] Diabetic Eye Exam No Retinopathy No Retinopathy       Pertinent labs & imaging results that were available during my care of the patient were reviewed by me and considered in my medical decision making.  Assessment & Plan:  Jahquez was seen today for annual exam.  Diagnoses and all orders for this visit:  Annual physical exam Health maintenance discussed in detail. Labs updated.  -     Lipid panel -     CBC with  Differential/Platelet -     CMP14+EGFR -     Bayer DCA Hb A1c Waived -     Thyroid Panel With TSH -     VITAMIN D 25 Hydroxy (Vit-D Deficiency, Fractures)  Uncontrolled type 2 diabetes mellitus with hyperglycemia (HCC) -     Bayer DCA Hb A1c Waived -     Thyroid Panel With TSH -     empagliflozin (JARDIANCE) 25 MG TABS tablet; TAKE  1 TABLET BY MOUTH EVERY DAY BEFORE BREAKFAST -     Dulaglutide (TRULICITY) 4.5 MG/0.5ML SOPN; Inject 4.5 mg as directed once a week.  Hypertension associated with diabetes (HCC) -     CBC with Differential/Platelet -     CMP14+EGFR -     amLODipine (NORVASC) 5 MG tablet; Take 1 tablet (5 mg total) by mouth daily. -     benazepril (LOTENSIN) 40 MG tablet; Take 1 tablet (40 mg total) by mouth daily.  Hypothyroidism (acquired)       -     Thyroid panel with TSH  Hyperlipidemia associated with type 2 diabetes mellitus (HCC) -     Lipid panel  Vitamin D deficiency -     VITAMIN D 25 Hydroxy (Vit-D Deficiency, Fractures)     Continue all other maintenance medications.  Follow up plan: Return in about 3 months (around 04/11/2023) for DM.   Continue healthy lifestyle choices, including diet (rich in fruits, vegetables, and lean proteins, and low in salt and simple carbohydrates) and exercise (at least 30 minutes of moderate physical activity daily).  Educational handout given for DM  The above assessment and management plan was discussed with the patient. The patient verbalized understanding of and has agreed to the management plan. Patient is aware to call the clinic if they develop any new symptoms or if symptoms persist or worsen. Patient is aware when to return to the clinic for a follow-up visit. Patient educated on when it is appropriate to go to the emergency department.   Kari Baars, FNP-C [facility] [phone]

## 2023-01-10 LAB — CMP14+EGFR
ALT: 25 IU/L (ref 0–44)
AST: 19 IU/L (ref 0–40)
Albumin/Globulin Ratio: 1.8
Albumin: 4.9 g/dL (ref 3.8–4.9)
Alkaline Phosphatase: 109 IU/L (ref 44–121)
BUN/Creatinine Ratio: 16 (ref 9–20)
BUN: 17 mg/dL (ref 6–24)
Bilirubin Total: <0.2 mg/dL (ref 0.0–1.2)
CO2: 21 mmol/L (ref 20–29)
Calcium: 10.2 mg/dL (ref 8.7–10.2)
Chloride: 101 mmol/L (ref 96–106)
Creatinine, Ser: 1.05 mg/dL (ref 0.76–1.27)
Globulin, Total: 2.7 g/dL (ref 1.5–4.5)
Glucose: 314 mg/dL — ABNORMAL HIGH (ref 70–99)
Potassium: 4.8 mmol/L (ref 3.5–5.2)
Sodium: 137 mmol/L (ref 134–144)
Total Protein: 7.6 g/dL (ref 6.0–8.5)
eGFR: 85 mL/min/{1.73_m2} (ref >59)

## 2023-01-10 LAB — LIPID PANEL
Chol/HDL Ratio: 4 ratio (ref 0.0–5.0)
Cholesterol, Total: 229 mg/dL — ABNORMAL HIGH (ref 100–199)
HDL: 57 mg/dL (ref >39)
LDL Chol Calc (NIH): 119 mg/dL — ABNORMAL HIGH (ref 0–99)
Triglycerides: 306 mg/dL — ABNORMAL HIGH (ref 0–149)
VLDL Cholesterol Cal: 53 mg/dL — ABNORMAL HIGH (ref 5–40)

## 2023-01-10 LAB — CBC WITH DIFFERENTIAL/PLATELET
Basophils Absolute: 0.1 10*3/uL (ref 0.0–0.2)
Basos: 1 %
EOS (ABSOLUTE): 0.1 10*3/uL (ref 0.0–0.4)
Eos: 2 %
Hematocrit: 49.8 % (ref 37.5–51.0)
Hemoglobin: 15.8 g/dL (ref 13.0–17.7)
Immature Grans (Abs): 0 10*3/uL (ref 0.0–0.1)
Immature Granulocytes: 0 %
Lymphocytes Absolute: 2.1 10*3/uL (ref 0.7–3.1)
Lymphs: 31 %
MCH: 29 pg (ref 26.6–33.0)
MCHC: 31.7 g/dL (ref 31.5–35.7)
MCV: 91 fL (ref 79–97)
Monocytes Absolute: 0.5 10*3/uL (ref 0.1–0.9)
Monocytes: 8 %
Neutrophils Absolute: 4 10*3/uL (ref 1.4–7.0)
Neutrophils: 58 %
Platelets: 348 10*3/uL (ref 150–450)
RBC: 5.45 x10E6/uL (ref 4.14–5.80)
RDW: 13.2 % (ref 11.6–15.4)
WBC: 6.8 10*3/uL (ref 3.4–10.8)

## 2023-01-10 LAB — VITAMIN D 25 HYDROXY (VIT D DEFICIENCY, FRACTURES): Vit D, 25-Hydroxy: 29 ng/mL — ABNORMAL LOW (ref 30.0–100.0)

## 2023-01-10 LAB — THYROID PANEL WITH TSH
Free Thyroxine Index: 3.7 (ref 1.2–4.9)
T3 Uptake Ratio: 34 % (ref 24–39)
T4, Total: 10.9 ug/dL (ref 4.5–12.0)
TSH: 0.078 u[IU]/mL — ABNORMAL LOW (ref 0.450–4.500)

## 2023-04-04 ENCOUNTER — Encounter: Payer: Self-pay | Admitting: Pharmacist

## 2023-04-08 ENCOUNTER — Ambulatory Visit: Payer: 59 | Admitting: Family Medicine

## 2023-04-08 ENCOUNTER — Encounter: Payer: Self-pay | Admitting: Family Medicine

## 2023-04-08 VITALS — BP 144/84 | HR 104 | Temp 98.0°F | Ht 65.0 in | Wt 156.8 lb

## 2023-04-08 DIAGNOSIS — E039 Hypothyroidism, unspecified: Secondary | ICD-10-CM | POA: Diagnosis not present

## 2023-04-08 DIAGNOSIS — E559 Vitamin D deficiency, unspecified: Secondary | ICD-10-CM | POA: Diagnosis not present

## 2023-04-08 DIAGNOSIS — Z794 Long term (current) use of insulin: Secondary | ICD-10-CM

## 2023-04-08 DIAGNOSIS — E785 Hyperlipidemia, unspecified: Secondary | ICD-10-CM

## 2023-04-08 DIAGNOSIS — E1165 Type 2 diabetes mellitus with hyperglycemia: Secondary | ICD-10-CM

## 2023-04-08 DIAGNOSIS — Z7984 Long term (current) use of oral hypoglycemic drugs: Secondary | ICD-10-CM

## 2023-04-08 DIAGNOSIS — R519 Headache, unspecified: Secondary | ICD-10-CM

## 2023-04-08 DIAGNOSIS — E1169 Type 2 diabetes mellitus with other specified complication: Secondary | ICD-10-CM | POA: Diagnosis not present

## 2023-04-08 LAB — BAYER DCA HB A1C WAIVED: HB A1C (BAYER DCA - WAIVED): 7.1 % — ABNORMAL HIGH (ref 4.8–5.6)

## 2023-04-08 MED ORDER — LANTUS SOLOSTAR 100 UNIT/ML ~~LOC~~ SOPN
32.0000 [IU] | PEN_INJECTOR | Freq: Every day | SUBCUTANEOUS | 99 refills | Status: DC
Start: 2023-04-08 — End: 2023-10-09

## 2023-04-08 NOTE — Progress Notes (Signed)
Subjective:  Patient ID: Justin [name], male    DOB: 1971-06-29, 52 y.o.   MRN: [id number]  Patient Care Team: Sonny Masters, FNP as PCP - General (Family Medicine) Wyline Mood Dorothe Pea, MD as PCP - Cardiology (Cardiology) Delora Fuel, OD (Optometry) Delora Fuel, OD (Optometry)   Chief Complaint:  Diabetes (3 month follow up )   HPI: Justin [name] is a 52 y.o. male presenting on 04/08/2023 for Diabetes (3 month follow up )   1. Uncontrolled type 2 diabetes mellitus with hyperglycemia (HCC) He has been taking his Trulicity, Jardiance, and Lantus, currently on 40 of lantus nightly. He has made significant dietary changes since last visit. Denies polyuria, polyphagia, or polydipsia. No visual changes.   2. Vitamin D deficiency Pt has not taking oral repletion therapy. Denies bone pain and tenderness, muscle weakness, fracture, and difficulty walking. Lab Results  Component Value Date   VD25OH 29.0 (L) 01/09/2023   VD25OH 30.2 02/14/2022   VD25OH 148.4 (H) 05/01/2021   Lab Results  Component Value Date   CALCIUM 10.2 01/09/2023   PHOS 3.6 06/30/2017     3. Hypothyroidism (acquired) Compliant with medications - Yes Current medications - synthroid 150 mcg Adverse side effects - No Weight - stable  Bowel habit changes - No Heat or cold intolerance - No Mood changes - No Changes in sleep habits - No Fatigue - No Skin, hair, or nail changes - No Tremor - No Palpitations - No Edema - No Shortness of breath - No   4. Hyperlipidemia associated with type 2 diabetes mellitus (HCC) Currently not taking his cholesterol medications. Has made significant dietary changes.   5. Left-sided headache Pt states over the last several months he will get a sharp pain on the left side of his head. States at times it is to the point he has to grab his head. He denies any other associated symptoms. Has not had to take anything for the symptoms.      Relevant past medical,  surgical, family, and social history reviewed and updated as indicated.  Allergies and medications reviewed and updated. Data reviewed: Chart in Epic.   Past Medical History:  Diagnosis Date   Diabetes mellitus (HCC)    Type 2 x 9 yrs.   Hyperlipidemia    Hypertension    x 5 yrs    Past Surgical History:  Procedure Laterality Date    rt knee arthroscopy     BIOPSY  03/21/2020   Procedure: BIOPSY;  Surgeon: Dolores Frame, MD;  Location: [facility];  Service: Gastroenterology;;   COLONOSCOPY  04/10/2012   Procedure: COLONOSCOPY;  Surgeon: Malissa Hippo, MD;  Location: [facility];  Service: Endoscopy;  Laterality: N/A;  1200   COLONOSCOPY WITH PROPOFOL N/A 03/21/2020   Procedure: COLONOSCOPY WITH PROPOFOL;  Surgeon: Dolores Frame, MD;  Location: [facility];  Service: Gastroenterology;  Laterality: N/A;  10:00   POLYPECTOMY  03/21/2020   Procedure: POLYPECTOMY;  Surgeon: Dolores Frame, MD;  Location: [facility];  Service: Gastroenterology;;    Social History   Socioeconomic History   Marital status: Married    Spouse name: Not on file   Number of children: Not on file   Years of education: Not on file   Highest education level: Not on file  Occupational History   Not on file  Tobacco Use   Smoking status: Every Day   Smokeless tobacco: Never  Tobacco comments:    Cigar a day  Vaping Use   Vaping status: Never Used  Substance and Sexual Activity   Alcohol use: Yes    Comment: 6 pack a week   Drug use: No   Sexual activity: Not on file  Other Topics Concern   Not on file  Social History Narrative   Married for 24 yrs.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   aspirin EC 81 MG tablet Take 81 mg by mouth daily. Swallow whole.   benazepril (LOTENSIN) 40 MG tablet Take 1 tablet (40 mg total) by mouth daily.   Dulaglutide (TRULICITY) 4.5 MG/0.5ML SOPN Inject 4.5 mg as directed once a week.   empagliflozin (JARDIANCE) 25 MG TABS tablet TAKE 1 TABLET BY MOUTH EVERY DAY BEFORE BREAKFAST   Insulin Pen Needle (BD PEN NEEDLE NANO 2ND GEN) 32G X 4 MM MISC Use with insulin daily Dx E11.65   levothyroxine (SYNTHROID) 150 MCG tablet Take 1 tablet (150 mcg total) by mouth daily.   pravastatin (PRAVACHOL) 20 MG tablet Take 1 tablet (20 mg total) by mouth daily.   sildenafil (VIAGRA) 100 MG tablet Take 0.5-1 tablets (50-100 mg total) by mouth daily as needed for erectile dysfunction.   insulin glargine (LANTUS SOLOSTAR) 100 UNIT/ML Solostar Pen Inject 32-40 Units into the skin at bedtime. Start with 32u at bedtime. Increase by 1 unit every TWO days if MORNING FASTING sugar above 150.   [DISCONTINUED] insulin glargine (LANTUS SOLOSTAR) 100 UNIT/ML Solostar Pen Inject 32-40 Units into the skin at bedtime. Start with 32u at bedtime. Increase by 1 unit every TWO days if MORNING FASTING sugar above 150.   No facility-administered encounter medications on file as of 04/08/2023.    Allergies  Allergen Reactions   Codeine     Hives, itching     Review of Systems  Constitutional:  Negative for activity change, appetite change, chills, diaphoresis, fatigue, fever and unexpected weight change.  HENT: Negative.    Eyes: Negative.  Negative for photophobia and visual disturbance.  Respiratory:  Negative for cough, chest tightness and shortness of breath.   Cardiovascular:  Negative for chest pain, palpitations and leg swelling.  Gastrointestinal:  Negative for abdominal pain, blood in stool, constipation, diarrhea,  nausea and vomiting.  Endocrine: Negative.  Negative for polydipsia, polyphagia and polyuria.  Genitourinary:  Negative for decreased urine volume, difficulty urinating, dysuria, frequency and urgency.  Musculoskeletal:  Negative for arthralgias and myalgias.  Skin: Negative.   Allergic/Immunologic: Negative.   Neurological:  Positive for headaches. Negative for dizziness, tremors, seizures, syncope, facial asymmetry, speech difficulty, weakness, light-headedness and numbness.  Hematological: Negative.   Psychiatric/Behavioral:  Negative for agitation, behavioral problems, confusion, decreased concentration, dysphoric mood, hallucinations, self-injury, sleep disturbance and suicidal ideas. The patient is not nervous/anxious and is not hyperactive.   All other systems reviewed and are negative.       Objective:  BP (!) 144/84   Pulse (!) 104   Temp 98 F (36.7 C) (Temporal)   Ht 5'  5" (1.651 m)   Wt 156 lb 12.8 oz (71.1 kg)   SpO2 96%   BMI 26.09 kg/m    Wt Readings from Last 3 Encounters:  04/08/23 156 lb 12.8 oz (71.1 kg)  01/09/23 157 lb 12.8 oz (71.6 kg)  09/10/22 154 lb (69.9 kg)    Physical Exam Vitals and nursing note reviewed.  Constitutional:      General: He is not in acute distress.    Appearance: Normal appearance. He is well-developed, well-groomed and overweight. He is not ill-appearing, toxic-appearing or diaphoretic.  HENT:     Head: Normocephalic and atraumatic.     Jaw: There is normal jaw occlusion.     Right Ear: Hearing normal.     Left Ear: Hearing normal.     Nose: Nose normal.     Mouth/Throat:     Lips: Pink.     Mouth: Mucous membranes are moist.     Pharynx: Oropharynx is clear. Uvula midline.  Eyes:     General: Lids are normal.     Extraocular Movements: Extraocular movements intact.     Conjunctiva/sclera: Conjunctivae normal.     Pupils: Pupils are equal, round, and reactive to light.  Neck:     Thyroid: No thyroid mass, thyromegaly or  thyroid tenderness.     Vascular: No carotid bruit or JVD.     Trachea: Trachea and phonation normal.  Cardiovascular:     Rate and Rhythm: Normal rate and regular rhythm.     Chest Wall: PMI is not displaced.     Pulses: Normal pulses.     Heart sounds: Normal heart sounds. No murmur heard.    No friction rub. No gallop.  Pulmonary:     Effort: Pulmonary effort is normal. No respiratory distress.     Breath sounds: Normal breath sounds. No wheezing.  Abdominal:     General: Bowel sounds are normal. There is no distension or abdominal bruit.     Palpations: Abdomen is soft. There is no hepatomegaly or splenomegaly.     Tenderness: There is no abdominal tenderness. There is no right CVA tenderness or left CVA tenderness.     Hernia: No hernia is present.  Musculoskeletal:        General: Normal range of motion.     Cervical back: Normal range of motion and neck supple.     Right lower leg: No edema.     Left lower leg: No edema.  Lymphadenopathy:     Cervical: No cervical adenopathy.  Skin:    General: Skin is warm and dry.     Capillary Refill: Capillary refill takes less than 2 seconds.     Coloration: Skin is not cyanotic, jaundiced or pale.     Findings: No rash.  Neurological:     General: No focal deficit present.     Mental Status: He is alert and oriented to person, place, and time.     Cranial Nerves: No cranial nerve deficit.     Sensory: Sensation is intact. No sensory deficit.     Motor: Motor function is intact. No weakness.     Coordination: Coordination is intact. Coordination normal.     Gait: Gait is intact. Gait normal.     Deep Tendon Reflexes: Reflexes are normal and symmetric. Reflexes normal.  Psychiatric:        Attention and Perception: Attention and perception normal.        Mood and Affect: Mood and affect normal.  Speech: Speech normal.        Behavior: Behavior normal. Behavior is cooperative.        Thought Content: Thought content normal.         Cognition and Memory: Cognition and memory normal.        Judgment: Judgment normal.     Results for orders placed or performed in visit on 01/09/23  Lipid panel  Result Value Ref Range   Cholesterol, Total 229 (H) 100 - 199 mg/dL   Triglycerides 962 (H) 0 - 149 mg/dL   HDL 57 >95 mg/dL   VLDL Cholesterol Cal 53 (H) 5 - 40 mg/dL   LDL Chol Calc (NIH) 284 (H) 0 - 99 mg/dL   Chol/HDL Ratio 4.0 0.0 - 5.0 ratio  CBC with Differential/Platelet  Result Value Ref Range   WBC 6.8 3.4 - 10.8 x10E3/uL   RBC 5.45 4.14 - 5.80 x10E6/uL   Hemoglobin 15.8 13.0 - 17.7 g/dL   Hematocrit 13.2 44.0 - 51.0 %   MCV 91 79 - 97 fL   MCH 29.0 26.6 - 33.0 pg   MCHC 31.7 31.5 - 35.7 g/dL   RDW 10.2 72.5 - 36.6 %   Platelets 348 150 - 450 x10E3/uL   Neutrophils 58 Not Estab. %   Lymphs 31 Not Estab. %   Monocytes 8 Not Estab. %   Eos 2 Not Estab. %   Basos 1 Not Estab. %   Neutrophils Absolute 4.0 1.4 - 7.0 x10E3/uL   Lymphocytes Absolute 2.1 0.7 - 3.1 x10E3/uL   Monocytes Absolute 0.5 0.1 - 0.9 x10E3/uL   EOS (ABSOLUTE) 0.1 0.0 - 0.4 x10E3/uL   Basophils Absolute 0.1 0.0 - 0.2 x10E3/uL   Immature Granulocytes 0 Not Estab. %   Immature Grans (Abs) 0.0 0.0 - 0.1 x10E3/uL  CMP14+EGFR  Result Value Ref Range   Glucose 314 (H) 70 - 99 mg/dL   BUN 17 6 - 24 mg/dL   Creatinine, Ser 4.40 0.76 - 1.27 mg/dL   eGFR 85 >34 VQ/QVZ/5.63   BUN/Creatinine Ratio 16 9 - 20   Sodium 137 134 - 144 mmol/L   Potassium 4.8 3.5 - 5.2 mmol/L   Chloride 101 96 - 106 mmol/L   CO2 21 20 - 29 mmol/L   Calcium 10.2 8.7 - 10.2 mg/dL   Total Protein 7.6 6.0 - 8.5 g/dL   Albumin 4.9 3.8 - 4.9 g/dL   Globulin, Total 2.7 1.5 - 4.5 g/dL   Albumin/Globulin Ratio 1.8    Bilirubin Total <0.2 0.0 - 1.2 mg/dL   Alkaline Phosphatase 109 44 - 121 IU/L   AST 19 0 - 40 IU/L   ALT 25 0 - 44 IU/L  Bayer DCA Hb A1c Waived  Result Value Ref Range   HB A1C (BAYER DCA - WAIVED) 9.7 (H) 4.8 - 5.6 %  Thyroid Panel With TSH   Result Value Ref Range   TSH 0.078 (L) 0.450 - 4.500 uIU/mL   T4, Total 10.9 4.5 - 12.0 ug/dL   T3 Uptake Ratio 34 24 - 39 %   Free Thyroxine Index 3.7 1.2 - 4.9  VITAMIN D 25 Hydroxy (Vit-D Deficiency, Fractures)  Result Value Ref Range   Vit D, 25-Hydroxy 29.0 (L) 30.0 - 100.0 ng/mL       Pertinent labs & imaging results that were available during my care of the patient were reviewed by me and considered in my medical decision making.  Assessment & Plan:  Justin [name] was seen  today for diabetes.  Diagnoses and all orders for this visit:  Uncontrolled type 2 diabetes mellitus with hyperglycemia (HCC) A1C 7.1 today, congratulated as this is the best is has been in months. Can cut back on Lantus dosing as the goal is to ultimately stop the insulin. Continue with diet and exercise.  -     Bayer DCA Hb A1c Waived -     insulin glargine (LANTUS SOLOSTAR) 100 UNIT/ML Solostar Pen; Inject 32-40 Units into the skin at bedtime. Start with 32u at bedtime. Increase by 1 unit every TWO days if MORNING FASTING sugar above 150.  Vitamin D deficiency Labs pending. Continue repletion therapy. If indicated, will change repletion dosage. Eat foods rich in Vit D including milk, orange juice, yogurt with vitamin D added, salmon or mackerel, canned tuna fish, cereals with vitamin D added, and cod liver oil. Get out in the sun but make sure to wear at least SPF 30 sunscreen.  -     VITAMIN D 25 Hydroxy (Vit-D Deficiency, Fractures)  Hypothyroidism (acquired) Thyroid disease has been well controlled. Labs are pending. Adjustments to regimen will be made if warranted. Make sure to take medications on an empty stomach with a full glass of water. Make sure to avoid vitamins or supplements for at least 4 hours before and 4 hours after taking medications. Repeat labs in 3 months if adjustments are made and in 6 months if stable.   -     Thyroid Panel With TSH  Hyperlipidemia associated with type 2 diabetes mellitus  (HCC) Diet encouraged - increase intake of fresh fruits and vegetables, increase intake of lean proteins. Bake, broil, or grill foods. Avoid fried, greasy, and fatty foods. Avoid fast foods. Increase intake of fiber-rich whole grains. Exercise encouraged - at least 150 minutes per week and advance as tolerated.  Goal BMI < 25. Start medications as prescribed. Follow up in 3-6 months as discussed.  -     Lipid panel -     CMP14+EGFR  Left-sided headache Will obtain head imaging for further evaluation.  -     CT HEAD WO CONTRAST ( ); Future    Continue all other maintenance medications.  Follow up plan: Return in about 3 months (around 07/08/2023) for DM.   Continue healthy lifestyle choices, including diet (rich in fruits, vegetables, and lean proteins, and low in salt and simple carbohydrates) and exercise (at least 30 minutes of moderate physical activity daily).  Educational handout given for DM  The above assessment and management plan was discussed with the patient. The patient verbalized understanding of and has agreed to the management plan. Patient is aware to call the clinic if they develop any new symptoms or if symptoms persist or worsen. Patient is aware when to return to the clinic for a follow-up visit. Patient educated on when it is appropriate to go to the emergency department.   Kari Baars, FNP-C [facility] [phone]

## 2023-04-08 NOTE — Patient Instructions (Addendum)

## 2023-04-09 LAB — CMP14+EGFR
ALT: 25 IU/L (ref 0–44)
AST: 19 IU/L (ref 0–40)
Albumin: 4.4 g/dL (ref 3.8–4.9)
Alkaline Phosphatase: 96 IU/L (ref 44–121)
BUN/Creatinine Ratio: 11 (ref 9–20)
BUN: 10 mg/dL (ref 6–24)
Bilirubin Total: <0.2 mg/dL (ref 0.0–1.2)
CO2: 22 mmol/L (ref 20–29)
Calcium: 9.8 mg/dL (ref 8.7–10.2)
Chloride: 103 mmol/L (ref 96–106)
Creatinine, Ser: 0.9 mg/dL (ref 0.76–1.27)
Globulin, Total: 2.7 g/dL (ref 1.5–4.5)
Glucose: 186 mg/dL — ABNORMAL HIGH (ref 70–99)
Potassium: 4.5 mmol/L (ref 3.5–5.2)
Sodium: 141 mmol/L (ref 134–144)
Total Protein: 7.1 g/dL (ref 6.0–8.5)
eGFR: 103 mL/min/{1.73_m2} (ref >59)

## 2023-04-09 LAB — LIPID PANEL
Chol/HDL Ratio: 3.7 ratio (ref 0.0–5.0)
Cholesterol, Total: 190 mg/dL (ref 100–199)
HDL: 51 mg/dL (ref >39)
LDL Chol Calc (NIH): 111 mg/dL — ABNORMAL HIGH (ref 0–99)
Triglycerides: 158 mg/dL — ABNORMAL HIGH (ref 0–149)
VLDL Cholesterol Cal: 28 mg/dL (ref 5–40)

## 2023-04-09 LAB — THYROID PANEL WITH TSH
Free Thyroxine Index: 2.4 (ref 1.2–4.9)
T3 Uptake Ratio: 28 % (ref 24–39)
T4, Total: 8.5 ug/dL (ref 4.5–12.0)
TSH: 0.023 u[IU]/mL — ABNORMAL LOW (ref 0.450–4.500)

## 2023-04-09 LAB — VITAMIN D 25 HYDROXY (VIT D DEFICIENCY, FRACTURES): Vit D, 25-Hydroxy: 47.3 ng/mL (ref 30.0–100.0)

## 2023-04-10 ENCOUNTER — Other Ambulatory Visit: Payer: Self-pay | Admitting: Family Medicine

## 2023-04-10 DIAGNOSIS — E039 Hypothyroidism, unspecified: Secondary | ICD-10-CM

## 2023-04-15 ENCOUNTER — Telehealth: Payer: Self-pay

## 2023-04-15 NOTE — Telephone Encounter (Signed)
Lmtcb to get most recent blood pressure reading

## 2023-05-15 ENCOUNTER — Ambulatory Visit (HOSPITAL_COMMUNITY): Payer: 59

## 2023-06-16 ENCOUNTER — Other Ambulatory Visit: Payer: Self-pay | Admitting: Family Medicine

## 2023-06-16 DIAGNOSIS — E1159 Type 2 diabetes mellitus with other circulatory complications: Secondary | ICD-10-CM

## 2023-06-16 DIAGNOSIS — E1165 Type 2 diabetes mellitus with hyperglycemia: Secondary | ICD-10-CM

## 2023-07-09 ENCOUNTER — Encounter: Payer: Self-pay | Admitting: Family Medicine

## 2023-07-09 ENCOUNTER — Ambulatory Visit: Payer: 59 | Admitting: Family Medicine

## 2023-07-09 VITALS — BP 133/83 | HR 107 | Temp 98.3°F | Ht 65.0 in | Wt 155.0 lb

## 2023-07-09 DIAGNOSIS — I152 Hypertension secondary to endocrine disorders: Secondary | ICD-10-CM

## 2023-07-09 DIAGNOSIS — Z794 Long term (current) use of insulin: Secondary | ICD-10-CM

## 2023-07-09 DIAGNOSIS — E785 Hyperlipidemia, unspecified: Secondary | ICD-10-CM

## 2023-07-09 DIAGNOSIS — E039 Hypothyroidism, unspecified: Secondary | ICD-10-CM | POA: Diagnosis not present

## 2023-07-09 DIAGNOSIS — E1169 Type 2 diabetes mellitus with other specified complication: Secondary | ICD-10-CM

## 2023-07-09 DIAGNOSIS — E1159 Type 2 diabetes mellitus with other circulatory complications: Secondary | ICD-10-CM

## 2023-07-09 LAB — BAYER DCA HB A1C WAIVED: HB A1C (BAYER DCA - WAIVED): 9.2 % — ABNORMAL HIGH (ref 4.8–5.6)

## 2023-07-09 MED ORDER — FREESTYLE LIBRE 3 PLUS SENSOR MISC: 11 refills | Status: DC

## 2023-07-09 MED ORDER — FREESTYLE LIBRE 3 READER DEVI: 1.0000 | Freq: Four times a day (QID) | 1 refills | Status: AC

## 2023-07-09 NOTE — Patient Instructions (Signed)

## 2023-07-09 NOTE — Progress Notes (Signed)
Subjective:  Patient ID: Justin [name], male    DOB: 13-Jun-1971, 52 y.o.   MRN: [id number]  Patient Care Team: Sonny Masters, FNP as PCP - General (Family Medicine) Wyline Mood Dorothe Pea, MD as PCP - Cardiology (Cardiology) Delora Fuel, OD (Optometry) Delora Fuel, OD (Optometry)   Chief Complaint:  Diabetes (3 month follow up )   HPI: Justin [name] is a 52 y.o. male presenting on 07/09/2023 for Diabetes (3 month follow up )   Discussed the use of AI scribe software for clinical note transcription with the patient, who gave verbal consent to proceed.  History of Present Illness   The patient, with a history of type 2 diabetes, hypertension, and thyroid disease, presents with concerns about their upcoming A1c results, suspecting they may be unfavorable due to recent dietary indiscretions. They report increased thirst and urination over the past month and a half, which they attribute to dehydration from inadequate water intake. After increasing their water consumption over the past week, they note feeling significantly better. They deny any issues with their feet and report no changes in vision.  Regarding their medication regimen, they confirm adherence to their prescribed Lantus insulin, with a recent increase from 25 to 30 units due to elevated blood sugars. They also report taking Jardiance, Norvasc, and Lotensin without any adverse effects. However, they admit to non-compliance with their prescribed pravastatin due to concerns about potential side effects.  The patient also reports no changes in hair, skin, or nails, and denies any symptoms of fatigue, constipation, or diarrhea, suggesting stable thyroid function. They express interest in obtaining a Freestyle Libre device for better glucose monitoring and management.          Relevant past medical, surgical, family, and social history reviewed and updated as indicated.  Allergies and medications reviewed and updated. Data  reviewed: Chart in Epic.   Past Medical History:  Diagnosis Date   Diabetes mellitus (HCC)    Type 2 x 9 yrs.   Hyperlipidemia    Hypertension    x 5 yrs    Past Surgical History:  Procedure Laterality Date    rt knee arthroscopy     BIOPSY  03/21/2020   Procedure: BIOPSY;  Surgeon: Dolores Frame, MD;  Location: [facility];  Service: Gastroenterology;;   COLONOSCOPY  04/10/2012   Procedure: COLONOSCOPY;  Surgeon: Malissa Hippo, MD;  Location: [facility];  Service: Endoscopy;  Laterality: N/A;  1200   COLONOSCOPY WITH PROPOFOL N/A 03/21/2020   Procedure: COLONOSCOPY WITH PROPOFOL;  Surgeon: Dolores Frame, MD;  Location: [facility];  Service: Gastroenterology;  Laterality: N/A;  10:00   POLYPECTOMY  03/21/2020   Procedure: POLYPECTOMY;  Surgeon: Dolores Frame, MD;  Location: [facility];  Service: Gastroenterology;;    Social History   Socioeconomic History   Marital status: Married    Spouse name: Not on file   Number of children: Not on file   Years of education: Not on file   Highest education level: Not on file  Occupational History   Not on file  Tobacco Use   Smoking status: Every Day   Smokeless tobacco: Never   Tobacco comments:    Cigar a day  Vaping Use   Vaping status: Never Used  Substance and Sexual Activity   Alcohol use: Yes    Comment: 6 pack a week   Drug use: No   Sexual activity: Not on file  Other Topics Concern   Not on file  Social History Narrative   Married for 24 yrs.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 IT for [employer] Determinants of Health   Financial Resource Strain: Not on file  Food Insecurity: Not on file  Transportation Needs: Not on file  Physical Activity: Not on file  Stress: Not on file  Social Connections: Not on file  Intimate Partner Violence: Not on file    Outpatient Encounter Medications as of 07/09/2023  Medication Sig   amLODipine (NORVASC) 5 MG tablet TAKE 1 TABLET  (5 MG TOTAL) BY MOUTH DAILY.   aspirin EC 81 MG tablet Take 81 mg by mouth daily. Swallow whole.   benazepril (LOTENSIN) 40 MG tablet TAKE 1 TABLET BY MOUTH EVERY DAY   Continuous Glucose Receiver (FREESTYLE LIBRE 3 READER) DEVI 1 Device by Does not apply route 4 (four) times daily.   Continuous Glucose Sensor (FREESTYLE LIBRE 3 PLUS SENSOR) MISC Change sensor every 15 days. QID blood sugar checks, on insulin   Insulin Pen Needle (BD PEN NEEDLE NANO 2ND GEN) 32G X 4 MM MISC Use with insulin daily Dx E11.65   JARDIANCE 25 MG TABS tablet TAKE 1 TABLET BY MOUTH EVERY DAY BEFORE BREAKFAST   levothyroxine (SYNTHROID) 150 MCG tablet TAKE 1 TABLET BY MOUTH EVERY DAY   pravastatin (PRAVACHOL) 20 MG tablet Take 1 tablet (20 mg total) by mouth daily.   sildenafil (VIAGRA) 100 MG tablet Take 0.5-1 tablets (50-100 mg total) by mouth daily as needed for erectile dysfunction.   insulin glargine (LANTUS SOLOSTAR) 100 UNIT/ML Solostar Pen Inject 32-40 Units into the skin at bedtime. Start with 32u at bedtime. Increase by 1 unit every TWO days if MORNING FASTING sugar above 150.   No facility-administered encounter medications on file as of 07/09/2023.    Allergies  Allergen Reactions   Codeine     Hives, itching     Pertinent ROS per HPI, otherwise unremarkable      Objective:  BP 133/83   Pulse (!) 107   Temp 98.3 F (36.8 C) (Temporal)   Ht 5\' 5"  (1.651 m)   Wt 155 lb (70.3 kg)   SpO2 96%   BMI 25.79 kg/m    Wt Readings from Last 3 Encounters:  07/09/23 155 lb (70.3 kg)  04/08/23 156 lb 12.8 oz (71.1 kg)  01/09/23 157 lb 12.8 oz (71.6 kg)    Physical Exam Vitals and nursing note reviewed.  Constitutional:      General: He is not in acute distress.    Appearance: Normal appearance. He is well-developed and well-groomed. He is not ill-appearing, toxic-appearing or diaphoretic.  HENT:     Head: Normocephalic and atraumatic.     Jaw: There is normal jaw occlusion.     Right Ear:  Hearing normal.     Left Ear: Hearing normal.     Nose: Nose normal.     Mouth/Throat:     Lips: Pink.     Mouth: Mucous membranes are moist.     Pharynx: Oropharynx is clear. Uvula midline.  Eyes:     General: Lids are normal.     Extraocular Movements: Extraocular movements intact.     Conjunctiva/sclera: Conjunctivae normal.     Pupils: Pupils are equal, round, and reactive to light.  Neck:     Thyroid: No thyroid mass, thyromegaly or thyroid tenderness.     Vascular: No carotid bruit or JVD.     Trachea: Trachea  and phonation normal.  Cardiovascular:     Rate and Rhythm: Normal rate and regular rhythm.     Chest Wall: PMI is not displaced.     Pulses: Normal pulses.          Dorsalis pedis pulses are 2+ on the right side and 2+ on the left side.       Posterior tibial pulses are 2+ on the right side and 2+ on the left side.     Heart sounds: Normal heart sounds. No murmur heard.    No friction rub. No gallop.  Pulmonary:     Effort: Pulmonary effort is normal. No respiratory distress.     Breath sounds: Normal breath sounds. No wheezing.  Abdominal:     General: Bowel sounds are normal.     Palpations: Abdomen is soft.  Musculoskeletal:        General: Normal range of motion.     Cervical back: Normal range of motion and neck supple.     Right lower leg: No edema.     Left lower leg: No edema.     Right foot: Normal range of motion. No deformity, bunion, Charcot foot, foot drop or prominent metatarsal heads.     Left foot: Normal range of motion. No deformity, bunion, Charcot foot, foot drop or prominent metatarsal heads.  Feet:     Right foot:     Protective Sensation: 10 sites tested.  10 sites sensed.     Skin integrity: Skin integrity normal.     Toenail Condition: Right toenails are normal.     Left foot:     Protective Sensation: 10 sites tested.  10 sites sensed.     Skin integrity: Skin integrity normal.     Toenail Condition: Left toenails are normal.   Lymphadenopathy:     Cervical: No cervical adenopathy.  Skin:    General: Skin is warm and dry.     Capillary Refill: Capillary refill takes less than 2 seconds.     Coloration: Skin is not cyanotic, jaundiced or pale.     Findings: No rash.  Neurological:     General: No focal deficit present.     Mental Status: He is alert and oriented to person, place, and time.     Sensory: Sensation is intact.     Motor: Motor function is intact.     Coordination: Coordination is intact.     Gait: Gait is intact.     Deep Tendon Reflexes: Reflexes are normal and symmetric.  Psychiatric:        Attention and Perception: Attention and perception normal.        Mood and Affect: Mood and affect normal.        Speech: Speech normal.        Behavior: Behavior normal. Behavior is cooperative.        Thought Content: Thought content normal.        Cognition and Memory: Cognition and memory normal.        Judgment: Judgment normal.    Physical Exam   VITALS: BP- 133/83 CHEST: Lungs clear to auscultation. NEUROLOGICAL: Sensation intact on the bottom of the feet. SKIN: Feet not dry, no calluses observed.        Results for orders placed or performed in visit on 04/08/23  Lipid panel  Result Value Ref Range   Cholesterol, Total 190 100 - 199 mg/dL   Triglycerides 119 (H) 0 - 149 mg/dL   HDL  51 >39 mg/dL   VLDL Cholesterol Cal 28 5 - 40 mg/dL   LDL Chol Calc (NIH) 161 (H) 0 - 99 mg/dL   Chol/HDL Ratio 3.7 0.0 - 5.0 ratio  CMP14+EGFR  Result Value Ref Range   Glucose 186 (H) 70 - 99 mg/dL   BUN 10 6 - 24 mg/dL   Creatinine, Ser 0.96 0.76 - 1.27 mg/dL   eGFR 045 >40 JW/JXB/1.47   BUN/Creatinine Ratio 11 9 - 20   Sodium 141 134 - 144 mmol/L   Potassium 4.5 3.5 - 5.2 mmol/L   Chloride 103 96 - 106 mmol/L   CO2 22 20 - 29 mmol/L   Calcium 9.8 8.7 - 10.2 mg/dL   Total Protein 7.1 6.0 - 8.5 g/dL   Albumin 4.4 3.8 - 4.9 g/dL   Globulin, Total 2.7 1.5 - 4.5 g/dL   Bilirubin Total <8.2 0.0 -  1.2 mg/dL   Alkaline Phosphatase 96 44 - 121 IU/L   AST 19 0 - 40 IU/L   ALT 25 0 - 44 IU/L  Bayer DCA Hb A1c Waived  Result Value Ref Range   HB A1C (BAYER DCA - WAIVED) 7.1 (H) 4.8 - 5.6 %  Thyroid Panel With TSH  Result Value Ref Range   TSH 0.023 (L) 0.450 - 4.500 uIU/mL   T4, Total 8.5 4.5 - 12.0 ug/dL   T3 Uptake Ratio 28 24 - 39 %   Free Thyroxine Index 2.4 1.2 - 4.9  VITAMIN D 25 Hydroxy (Vit-D Deficiency, Fractures)  Result Value Ref Range   Vit D, 25-Hydroxy 47.3 30.0 - 100.0 ng/mL       Pertinent labs & imaging results that were available during my care of the patient were reviewed by me and considered in my medical decision making.  Assessment & Plan:  Cotter was seen today for diabetes.  Diagnoses and all orders for this visit:  Type 2 diabetes mellitus with other specified complication, with long-term current use of insulin (HCC) -     Bayer DCA Hb A1c Waived -     Microalbumin / creatinine urine ratio -     Continuous Glucose Sensor (FREESTYLE LIBRE 3 PLUS SENSOR) MISC; Change sensor every 15 days. QID blood sugar checks, on insulin -     Continuous Glucose Receiver (FREESTYLE LIBRE 3 READER) DEVI; 1 Device by Does not apply route 4 (four) times daily.  Hypothyroidism (acquired) Doing well on current medications.   Hyperlipidemia associated with type 2 diabetes mellitus (HCC) Discussed restarting pravastatin as below.   Hypertension associated with diabetes (HCC) -     Microalbumin / creatinine urine ratio     Assessment and Plan    Type 2 Diabetes Mellitus Type 2 diabetes mellitus with A1c of 9.2%, indicating poor glycemic control. Reports increased thirst and dehydration, likely due to inadequate water intake. Currently on Jardiance and Lantus. Non-compliant with pravastatin for cardiovascular risk management. Discussed the importance of hydration to prevent dehydration and potential yeast infections. Explained Jardiance's mechanism and the benefits of  continuous glucose monitoring with Freestyle Libre. - Increase water intake. - Continue Jardiance and adjust Lantus to 30 units. - Prescribe Freestyle Libre. - Perform A1c and urine albumin tests today. - Encourage taking pravastatin at least twice a week. - Follow-up in three months for lab work and reassessment.  Hypertension Hypertension well-controlled with BP 133/83 mmHg. On Norvasc and Lotensin with no side effects. - Continue Norvasc and Lotensin.  Hyperlipidemia Hyperlipidemia management suboptimal due to irregular  pravastatin intake. Increased cardiovascular risk due to diabetes and hypertension. Discussed benefits of taking pravastatin two to three times a week. - Encourage taking pravastatin at least twice a week, starting with half a tablet.  Hypothyroidism Hypothyroidism well-managed with no reported symptoms. - Continue current thyroid management.  General Health Maintenance No changes in vision; retinal exam scheduled next month. Foot examination shows no abnormalities. - Ensure retinal exam results are sent to the clinic. - Continue regular foot examinations.  Follow-up - Schedule follow-up in three months for lab work and reassessment. - Contact clinic if issues with [facility] or if it is expensive.          Continue all other maintenance medications.  Follow up plan: Return in about 3 months (around 10/07/2023) for DM and all labs.   Continue healthy lifestyle choices, including diet (rich in fruits, vegetables, and lean proteins, and low in salt and simple carbohydrates) and exercise (at least 30 minutes of moderate physical activity daily).  Educational handout given for DM  The above assessment and management plan was discussed with the patient. The patient verbalized understanding of and has agreed to the management plan. Patient is aware to call the clinic if they develop any new symptoms or if symptoms persist or worsen. Patient is aware when to  return to the clinic for a follow-up visit. Patient educated on when it is appropriate to go to the emergency department.   Kari Baars, FNP-C [facility] [phone]

## 2023-07-10 ENCOUNTER — Other Ambulatory Visit: Payer: Self-pay | Admitting: Family Medicine

## 2023-07-10 DIAGNOSIS — E785 Hyperlipidemia, unspecified: Secondary | ICD-10-CM

## 2023-07-10 LAB — MICROALBUMIN / CREATININE URINE RATIO
Creatinine, Urine: 123.7 mg/dL
Microalb/Creat Ratio: 23 mg/g{creat} (ref 0–29)
Microalbumin, Urine: 28.2 ug/mL

## 2023-07-27 ENCOUNTER — Other Ambulatory Visit: Payer: Self-pay | Admitting: Family Medicine

## 2023-07-27 DIAGNOSIS — E1165 Type 2 diabetes mellitus with hyperglycemia: Secondary | ICD-10-CM

## 2023-08-05 ENCOUNTER — Other Ambulatory Visit: Payer: Self-pay | Admitting: Family Medicine

## 2023-08-05 DIAGNOSIS — E1169 Type 2 diabetes mellitus with other specified complication: Secondary | ICD-10-CM

## 2023-08-05 NOTE — Telephone Encounter (Signed)
Name from pharmacy: FREESTYLE LIBRE 3 PLUS SENSOR   Pharmacy comment: Alternative Requested:NOT COVERED.

## 2023-08-06 MED ORDER — FREESTYLE LIBRE 3 PLUS SENSOR MISC
11 refills | Status: DC
Start: 2023-08-06 — End: 2023-10-27

## 2023-08-06 NOTE — Addendum Note (Signed)
 Addended by: Julious Payer D on: 08/06/2023 08:34 AM   Modules accepted: Orders

## 2023-08-19 LAB — HM DIABETES EYE EXAM

## 2023-08-29 ENCOUNTER — Other Ambulatory Visit: Payer: Self-pay | Admitting: Family Medicine

## 2023-08-29 DIAGNOSIS — E1165 Type 2 diabetes mellitus with hyperglycemia: Secondary | ICD-10-CM

## 2023-08-29 DIAGNOSIS — E1159 Type 2 diabetes mellitus with other circulatory complications: Secondary | ICD-10-CM

## 2023-10-09 ENCOUNTER — Ambulatory Visit (INDEPENDENT_AMBULATORY_CARE_PROVIDER_SITE_OTHER): Payer: 59 | Admitting: Family Medicine

## 2023-10-09 ENCOUNTER — Encounter: Payer: Self-pay | Admitting: Family Medicine

## 2023-10-09 VITALS — BP 151/92 | HR 94 | Temp 98.2°F | Ht 65.0 in | Wt 159.0 lb

## 2023-10-09 DIAGNOSIS — E1169 Type 2 diabetes mellitus with other specified complication: Secondary | ICD-10-CM

## 2023-10-09 DIAGNOSIS — E785 Hyperlipidemia, unspecified: Secondary | ICD-10-CM

## 2023-10-09 DIAGNOSIS — E559 Vitamin D deficiency, unspecified: Secondary | ICD-10-CM | POA: Diagnosis not present

## 2023-10-09 DIAGNOSIS — E039 Hypothyroidism, unspecified: Secondary | ICD-10-CM

## 2023-10-09 DIAGNOSIS — E1165 Type 2 diabetes mellitus with hyperglycemia: Secondary | ICD-10-CM

## 2023-10-09 DIAGNOSIS — Z794 Long term (current) use of insulin: Secondary | ICD-10-CM

## 2023-10-09 DIAGNOSIS — E1159 Type 2 diabetes mellitus with other circulatory complications: Secondary | ICD-10-CM | POA: Diagnosis not present

## 2023-10-09 DIAGNOSIS — I152 Hypertension secondary to endocrine disorders: Secondary | ICD-10-CM

## 2023-10-09 LAB — BAYER DCA HB A1C WAIVED: HB A1C (BAYER DCA - WAIVED): 9.1 % — ABNORMAL HIGH (ref 4.8–5.6)

## 2023-10-09 LAB — LIPID PANEL

## 2023-10-09 MED ORDER — AMLODIPINE BESYLATE 5 MG PO TABS: 5.0000 mg | ORAL_TABLET | Freq: Every day | ORAL | 1 refills | Status: DC

## 2023-10-09 MED ORDER — EMPAGLIFLOZIN 25 MG PO TABS: 25.0000 mg | ORAL_TABLET | Freq: Every day | ORAL | 3 refills | Status: AC

## 2023-10-09 MED ORDER — BENAZEPRIL HCL 40 MG PO TABS: 40.0000 mg | ORAL_TABLET | Freq: Every day | ORAL | 1 refills | Status: DC

## 2023-10-09 MED ORDER — PRAVASTATIN SODIUM 20 MG PO TABS: 20.0000 mg | ORAL_TABLET | Freq: Every day | ORAL | 1 refills | Status: DC

## 2023-10-09 MED ORDER — LANTUS SOLOSTAR 100 UNIT/ML ~~LOC~~ SOPN: 32.0000 [IU] | PEN_INJECTOR | Freq: Every day | SUBCUTANEOUS | 99 refills | Status: AC

## 2023-10-09 NOTE — Patient Instructions (Signed)

## 2023-10-09 NOTE — Progress Notes (Signed)
 Subjective:  Patient ID: Justin [name], male    DOB: 1971-01-13, 53 y.o.   MRN: [id number]  Patient Care Team: Sonny Masters, FNP as PCP - General (Family Medicine) Wyline Mood Dorothe Pea, MD as PCP - Cardiology (Cardiology) Delora Fuel, OD (Optometry) Delora Fuel, OD (Optometry)   Chief Complaint:  No chief complaint on file.   HPI: Justin [name] is a 53 y.o. male presenting on 10/09/2023 for No chief complaint on file.   Discussed the use of AI scribe software for clinical note transcription with the patient, who gave verbal consent to proceed.  History of Present Illness   The patient presents with uncontrolled blood sugars after discontinuation of Jardiance due to insurance changes.  He has been experiencing uncontrolled blood sugars after discontinuing Jardiance two weeks ago due to insurance changes. Since stopping the medication, his blood sugars have been poorly controlled. He has not experienced increased hunger, thirst, or urination, but notes less urination since discontinuing Jardiance. He is currently taking 40 units of insulin, which he feels is not adequately controlling his blood sugars.  He is not currently taking his cholesterol medication, pravastatin, due to concerns about side effects, although he has not tried it yet. He is taking Synthroid without any reported issues and continues to take aspirin.  He reports not sleeping well, particularly since the recent death of a family member, which has caused significant stress. He found his aunt deceased, which has been emotionally challenging.  He is taking Norvasc for blood pressure management and has been on benazepril, which needed a refill. He takes 40 mg of benazepril daily.  He is taking vitamin D3 daily, denies arthralgias or trouble walking.       Relevant past medical, surgical, family, and social history reviewed and updated as indicated.  Allergies and medications reviewed and updated. Data  reviewed: Chart in Epic.   Past Medical History:  Diagnosis Date   Diabetes mellitus (HCC)    Type 2 x 9 yrs.   Hyperlipidemia    Hypertension    x 5 yrs    Past Surgical History:  Procedure Laterality Date    rt knee arthroscopy     BIOPSY  03/21/2020   Procedure: BIOPSY;  Surgeon: Dolores Frame, MD;  Location: [facility];  Service: Gastroenterology;;   COLONOSCOPY  04/10/2012   Procedure: COLONOSCOPY;  Surgeon: Malissa Hippo, MD;  Location: [facility];  Service: Endoscopy;  Laterality: N/A;  1200   COLONOSCOPY WITH PROPOFOL N/A 03/21/2020   Procedure: COLONOSCOPY WITH PROPOFOL;  Surgeon: Dolores Frame, MD;  Location: [facility];  Service: Gastroenterology;  Laterality: N/A;  10:00   POLYPECTOMY  03/21/2020   Procedure: POLYPECTOMY;  Surgeon: Dolores Frame, MD;  Location: [facility];  Service: Gastroenterology;;    Social History   Socioeconomic History   Marital status: Married    Spouse name: Not on file   Number of children: Not on file   Years of education: Not on file   Highest education level: Bachelor's degree (e.g., BA, AB, BS)  Occupational History   Not on file  Tobacco Use   Smoking status: Every Day   Smokeless tobacco: Never   Tobacco comments:    Cigar a day  Vaping Use   Vaping status: Never Used  Substance and Sexual Activity   Alcohol use: Yes    Comment: 6 pack a week   Drug use: No   Sexual activity:  Not on file  Other Topics Concern   Not on file  Social History Narrative   Married for 24 yrs.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 IT for [employer]   Social Drivers of Health   Financial Resource Strain: Low Risk  (10/08/2023)   Overall Financial Resource Strain (CARDIA)    Difficulty of Paying Living Expenses: Not very hard  Food Insecurity: No Food Insecurity (10/08/2023)   Hunger Vital Sign    Worried About Running Out of Food in the Last Year: Never true    Ran Out of Food in the Last Year: Never true   Transportation Needs: No Transportation Needs (10/08/2023)   PRAPARE - Administrator, Civil Service (Medical): No    Lack of Transportation (Non-Medical): No  Physical Activity: Insufficiently Active (10/08/2023)   Exercise Vital Sign    Days of Exercise per Week: 3 days    Minutes of Exercise per Session: 40 min  Stress: No Stress Concern Present (10/08/2023)   [employer] of Occupational Health - Occupational Stress Questionnaire    Feeling of Stress : Not at all  Social Connections: Moderately Integrated (10/08/2023)   Social Connection and Isolation Panel [NHANES]    Frequency of Communication with Friends and Family: More than three times a week    Frequency of Social Gatherings with Friends and Family: More than three times a week    Attends Religious Services: 1 to 4 times per year    Active Member of [employer] or Organizations: No    Attends Banker Meetings: Not on file    Marital Status: Married  Intimate Partner Violence: Not on file    Outpatient Encounter Medications as of 10/09/2023  Medication Sig   aspirin EC 81 MG tablet Take 81 mg by mouth daily. Swallow whole.   BD PEN NEEDLE NANO 2ND GEN 32G X 4 MM MISC USE WITH INSULIN DAILY DX E11.65   Continuous Glucose Receiver (FREESTYLE LIBRE 3 READER) DEVI 1 Device by Does not apply route 4 (four) times daily.   Continuous Glucose Sensor (FREESTYLE LIBRE 3 PLUS SENSOR) MISC CHANGE SENSOR EVERY 15 DAYS. TEST 4X DAILY BLOOD SUGAR CHECKS, ON INSULIN   levothyroxine (SYNTHROID) 150 MCG tablet TAKE 1 TABLET BY MOUTH EVERY DAY   sildenafil (VIAGRA) 100 MG tablet Take 0.5-1 tablets (50-100 mg total) by mouth daily as needed for erectile dysfunction.   [DISCONTINUED] amLODipine (NORVASC) 5 MG tablet TAKE 1 TABLET (5 MG TOTAL) BY MOUTH DAILY.   [DISCONTINUED] benazepril (LOTENSIN) 40 MG tablet TAKE 1 TABLET BY MOUTH EVERY DAY   amLODipine (NORVASC) 5 MG tablet Take 1 tablet (5 mg total) by mouth daily.    benazepril (LOTENSIN) 40 MG tablet Take 1 tablet (40 mg total) by mouth daily.   empagliflozin (JARDIANCE) 25 MG TABS tablet Take 1 tablet (25 mg total) by mouth daily.   insulin glargine (LANTUS SOLOSTAR) 100 UNIT/ML Solostar Pen Inject 32-40 Units into the skin at bedtime. Start with 32u at bedtime. Increase by 1 unit every TWO days if MORNING FASTING sugar above 150.   pravastatin (PRAVACHOL) 20 MG tablet Take 1 tablet (20 mg total) by mouth daily.   [DISCONTINUED] insulin glargine (LANTUS SOLOSTAR) 100 UNIT/ML Solostar Pen Inject 32-40 Units into the skin at bedtime. Start with 32u at bedtime. Increase by 1 unit every TWO days if MORNING FASTING sugar above 150.   [DISCONTINUED] JARDIANCE 25 MG TABS tablet TAKE 1 TABLET BY MOUTH EVERY DAY BEFORE BREAKFAST (Patient not  taking: Reported on 10/09/2023)   [DISCONTINUED] pravastatin (PRAVACHOL) 20 MG tablet TAKE 1 TABLET BY MOUTH EVERY DAY (Patient not taking: Reported on 10/09/2023)   No facility-administered encounter medications on file as of 10/09/2023.    Allergies  Allergen Reactions   Codeine     Hives, itching     Pertinent ROS per HPI, otherwise unremarkable      Objective:  BP (!) 151/92   Pulse 94   Temp 98.2 F (36.8 C)   Ht 5\' 5"  (1.651 m)   Wt 159 lb (72.1 kg)   SpO2 99%   BMI 26.46 kg/m    Wt Readings from Last 3 Encounters:  10/09/23 159 lb (72.1 kg)  07/09/23 155 lb (70.3 kg)  04/08/23 156 lb 12.8 oz (71.1 kg)    Physical Exam Vitals and nursing note reviewed.  Constitutional:      General: He is not in acute distress.    Appearance: Normal appearance. He is not ill-appearing, toxic-appearing or diaphoretic.  HENT:     Head: Normocephalic and atraumatic.     Nose: Nose normal.     Mouth/Throat:     Mouth: Mucous membranes are moist.  Eyes:     Conjunctiva/sclera: Conjunctivae normal.     Pupils: Pupils are equal, round, and reactive to light.  Cardiovascular:     Rate and Rhythm: Normal rate and  regular rhythm.     Heart sounds: Normal heart sounds.  Pulmonary:     Effort: Pulmonary effort is normal.     Breath sounds: Normal breath sounds.  Musculoskeletal:     Cervical back: Neck supple.     Right lower leg: No edema.     Left lower leg: No edema.  Skin:    General: Skin is warm and dry.     Capillary Refill: Capillary refill takes less than 2 seconds.  Neurological:     General: No focal deficit present.     Mental Status: He is alert and oriented to person, place, and time.  Psychiatric:        Mood and Affect: Mood normal.        Behavior: Behavior normal.        Thought Content: Thought content normal.        Judgment: Judgment normal.     Results for orders placed or performed in visit on 07/09/23  Bayer DCA Hb A1c Waived   Collection Time: 07/09/23  8:13 AM  Result Value Ref Range   HB A1C (BAYER DCA - WAIVED) 9.2 (H) 4.8 - 5.6 %  Microalbumin / creatinine urine ratio   Collection Time: 07/09/23  8:31 AM  Result Value Ref Range   Creatinine, Urine 123.7 Not Estab. mg/dL   Microalbumin, Urine 16.1 Not Estab. ug/mL   Microalb/Creat Ratio 23 0 - 29 mg/g creat       Pertinent labs & imaging results that were available during my care of the patient were reviewed by me and considered in my medical decision making.  Assessment & Plan:  Diagnoses and all orders for this visit:  Type 2 diabetes mellitus with other specified complication, with long-term current use of insulin (HCC) -     Bayer DCA Hb A1c Waived -     benazepril (LOTENSIN) 40 MG tablet; Take 1 tablet (40 mg total) by mouth daily. -     insulin glargine (LANTUS SOLOSTAR) 100 UNIT/ML Solostar Pen; Inject 32-40 Units into the skin at bedtime. Start with 32u at bedtime.  Increase by 1 unit every TWO days if MORNING FASTING sugar above 150. -     pravastatin (PRAVACHOL) 20 MG tablet; Take 1 tablet (20 mg total) by mouth daily. -     empagliflozin (JARDIANCE) 25 MG TABS tablet; Take 1 tablet (25 mg  total) by mouth daily.  Hypothyroidism (acquired) -     Thyroid Panel With TSH  Hyperlipidemia associated with type 2 diabetes mellitus (HCC) -     Lipid panel -     pravastatin (PRAVACHOL) 20 MG tablet; Take 1 tablet (20 mg total) by mouth daily.  Hypertension associated with diabetes (HCC) -     CBC with Differential/Platelet -     CMP14+EGFR -     amLODipine (NORVASC) 5 MG tablet; Take 1 tablet (5 mg total) by mouth daily. -     benazepril (LOTENSIN) 40 MG tablet; Take 1 tablet (40 mg total) by mouth daily.  Vitamin D deficiency -     VITAMIN D 25 Hydroxy (Vit-D Deficiency, Fractures)  Uncontrolled type 2 diabetes mellitus with hyperglycemia (HCC) -     insulin glargine (LANTUS SOLOSTAR) 100 UNIT/ML Solostar Pen; Inject 32-40 Units into the skin at bedtime. Start with 32u at bedtime. Increase by 1 unit every TWO days if MORNING FASTING sugar above 150. -     empagliflozin (JARDIANCE) 25 MG TABS tablet; Take 1 tablet (25 mg total) by mouth daily.     Assessment and Plan    Type 2 Diabetes Mellitus He has been off Jardiance for two weeks due to insurance coverage issues, leading to suboptimal blood sugar control. His A1c is currently 9.1%, an improvement from previous levels but still above the target of below 7%. He is experiencing less urination, expected due to the absence of Jardiance. Maintaining blood sugar control is crucial to prevent complications such as stroke and myocardial infarction. Alternatives like Farxiga or adding losartan for kidney protection were considered if Jardiance cannot be covered. - Check for Jardiance samples and provide a copay card to reduce cost. - Continue insulin therapy. - Monitor blood glucose levels regularly. - Reassess A1c in three months.  Hyperlipidemia He has not been taking pravastatin due to concerns about side effects. Pravastatin was chosen for its minimal side effects among statins. Cholesterol management is important in preventing  cardiovascular events, especially given his uncontrolled A1c levels. - Start pravastatin three times a week. - Monitor for side effects and adjust as needed.  Hypertension His blood pressure is slightly elevated. He is currently on Norvasc and benazepril. Adding losartan was considered but decided against after confirming he is taking benazepril. Blood pressure control is important to prevent complications. - Continue Norvasc and benazepril. - Monitor blood pressure regularly and report if it remains elevated.  Hypothyroidism He is on Synthroid and reports feeling well with no issues related to thyroid function. - Check thyroid function tests.  Vitamin D Deficiency He is taking daily vitamin D3. Vitamin D levels will be checked to determine if a higher dose is needed. If levels are below 20 ng/mL, weekly supplementation may be considered. - Check vitamin D levels. - Consider weekly vitamin D supplementation if levels are below 20 ng/mL.  Stress and Sleep Disturbances He is experiencing stress and sleep disturbances due to a recent family bereavement. He is not interested in medication for sleep at this time. - Provide emotional support and encourage communication with support networks. - Reassess sleep and emotional well-being at the next visit.  Continue all other maintenance medications.  Follow up plan: Return in about 3 months (around 01/09/2024) for DM.   Continue healthy lifestyle choices, including diet (rich in fruits, vegetables, and lean proteins, and low in salt and simple carbohydrates) and exercise (at least 30 minutes of moderate physical activity daily).  Educational handout given for DM  The above assessment and management plan was discussed with the patient. The patient verbalized understanding of and has agreed to the management plan. Patient is aware to call the clinic if they develop any new symptoms or if symptoms persist or worsen. Patient is aware  when to return to the clinic for a follow-up visit. Patient educated on when it is appropriate to go to the emergency department.   Kari Baars, FNP-C [facility] [phone]

## 2023-10-10 ENCOUNTER — Encounter: Payer: Self-pay | Admitting: Family Medicine

## 2023-10-10 LAB — CBC WITH DIFFERENTIAL/PLATELET
Basophils Absolute: 0.1 10*3/uL (ref 0.0–0.2)
Basos: 1 %
EOS (ABSOLUTE): 0.1 10*3/uL (ref 0.0–0.4)
Eos: 2 %
Hematocrit: 48 % (ref 37.5–51.0)
Hemoglobin: 15.8 g/dL (ref 13.0–17.7)
Immature Grans (Abs): 0 10*3/uL (ref 0.0–0.1)
Immature Granulocytes: 0 %
Lymphocytes Absolute: 2.4 10*3/uL (ref 0.7–3.1)
Lymphs: 41 %
MCH: 29.5 pg (ref 26.6–33.0)
MCHC: 32.9 g/dL (ref 31.5–35.7)
MCV: 90 fL (ref 79–97)
Monocytes Absolute: 0.6 10*3/uL (ref 0.1–0.9)
Monocytes: 10 %
Neutrophils Absolute: 2.7 10*3/uL (ref 1.4–7.0)
Neutrophils: 46 %
Platelets: 373 10*3/uL (ref 150–450)
RBC: 5.36 x10E6/uL (ref 4.14–5.80)
RDW: 12.9 % (ref 11.6–15.4)
WBC: 5.8 10*3/uL (ref 3.4–10.8)

## 2023-10-10 LAB — CMP14+EGFR
ALT: 24 IU/L (ref 0–44)
AST: 22 IU/L (ref 0–40)
Albumin: 4.4 g/dL (ref 3.8–4.9)
Alkaline Phosphatase: 111 IU/L (ref 44–121)
BUN/Creatinine Ratio: 10 (ref 9–20)
BUN: 9 mg/dL (ref 6–24)
CO2: 22 mmol/L (ref 20–29)
Calcium: 9.7 mg/dL (ref 8.7–10.2)
Chloride: 105 mmol/L (ref 96–106)
Creatinine, Ser: 0.88 mg/dL (ref 0.76–1.27)
Globulin, Total: 2.8 g/dL (ref 1.5–4.5)
Glucose: 149 mg/dL — ABNORMAL HIGH (ref 70–99)
Potassium: 4.6 mmol/L (ref 3.5–5.2)
Sodium: 144 mmol/L (ref 134–144)
Total Protein: 7.2 g/dL (ref 6.0–8.5)
eGFR: 103 mL/min/{1.73_m2} (ref >59)

## 2023-10-10 LAB — LIPID PANEL
Cholesterol, Total: 175 mg/dL (ref 100–199)
HDL: 54 mg/dL (ref >39)
LDL CALC COMMENT:: 3.2 ratio (ref 0.0–5.0)
LDL Chol Calc (NIH): 102 mg/dL — ABNORMAL HIGH (ref 0–99)
Triglycerides: 103 mg/dL (ref 0–149)
VLDL Cholesterol Cal: 19 mg/dL (ref 5–40)

## 2023-10-10 LAB — VITAMIN D 25 HYDROXY (VIT D DEFICIENCY, FRACTURES): Vit D, 25-Hydroxy: 42 ng/mL (ref 30.0–100.0)

## 2023-10-10 LAB — THYROID PANEL WITH TSH
Free Thyroxine Index: 3.5 (ref 1.2–4.9)
T3 Uptake Ratio: 33 % (ref 24–39)
T4, Total: 10.6 ug/dL (ref 4.5–12.0)
TSH: 0.071 u[IU]/mL — ABNORMAL LOW (ref 0.450–4.500)

## 2023-10-25 ENCOUNTER — Other Ambulatory Visit: Payer: Self-pay | Admitting: Family Medicine

## 2023-10-25 DIAGNOSIS — Z794 Long term (current) use of insulin: Secondary | ICD-10-CM

## 2023-10-27 MED ORDER — FREESTYLE LIBRE 3 PLUS SENSOR MISC: 3 refills | Status: AC

## 2023-10-27 NOTE — Telephone Encounter (Signed)
Refill failed, resent 

## 2023-10-27 NOTE — Addendum Note (Signed)
 Addended by: Julious Payer D on: 10/27/2023 04:45 PM   Modules accepted: Orders

## 2023-10-27 NOTE — Telephone Encounter (Signed)
 Name from pharmacy: FREESTYLE LIBRE 3 PLUS SENSOR   Pharmacy comment: Alternative Requested:PER INSURANCE NO LONGER COVERED

## 2024-01-09 ENCOUNTER — Ambulatory Visit: Admitting: Family Medicine

## 2024-02-22 ENCOUNTER — Other Ambulatory Visit: Payer: Self-pay | Admitting: Family Medicine

## 2024-02-22 DIAGNOSIS — E039 Hypothyroidism, unspecified: Secondary | ICD-10-CM

## 2024-04-11 ENCOUNTER — Other Ambulatory Visit: Payer: Self-pay | Admitting: Family Medicine

## 2024-04-11 DIAGNOSIS — E1169 Type 2 diabetes mellitus with other specified complication: Secondary | ICD-10-CM

## 2024-04-11 DIAGNOSIS — Z794 Long term (current) use of insulin: Secondary | ICD-10-CM

## 2024-04-28 ENCOUNTER — Other Ambulatory Visit: Payer: Self-pay | Admitting: Family Medicine

## 2024-04-28 DIAGNOSIS — E1169 Type 2 diabetes mellitus with other specified complication: Secondary | ICD-10-CM

## 2024-04-28 DIAGNOSIS — I152 Hypertension secondary to endocrine disorders: Secondary | ICD-10-CM

## 2024-04-28 NOTE — Telephone Encounter (Signed)
 Michelle NTBS 30-d given today

## 2024-04-28 NOTE — Telephone Encounter (Signed)
 Appt scheduled for 06/04/2024

## 2024-05-05 ENCOUNTER — Other Ambulatory Visit: Payer: Self-pay | Admitting: Family Medicine

## 2024-05-05 DIAGNOSIS — E039 Hypothyroidism, unspecified: Secondary | ICD-10-CM

## 2024-05-07 ENCOUNTER — Other Ambulatory Visit: Payer: Self-pay | Admitting: Family Medicine

## 2024-05-07 DIAGNOSIS — E1169 Type 2 diabetes mellitus with other specified complication: Secondary | ICD-10-CM

## 2024-06-04 ENCOUNTER — Encounter: Payer: Self-pay | Admitting: Family Medicine

## 2024-06-04 ENCOUNTER — Ambulatory Visit: Payer: Self-pay | Admitting: Family Medicine

## 2024-06-04 VITALS — BP 130/76 | HR 97 | Temp 98.0°F | Ht 65.0 in | Wt 161.0 lb

## 2024-06-04 DIAGNOSIS — E039 Hypothyroidism, unspecified: Secondary | ICD-10-CM | POA: Diagnosis not present

## 2024-06-04 DIAGNOSIS — E1169 Type 2 diabetes mellitus with other specified complication: Secondary | ICD-10-CM

## 2024-06-04 DIAGNOSIS — Z794 Long term (current) use of insulin: Secondary | ICD-10-CM | POA: Diagnosis not present

## 2024-06-04 DIAGNOSIS — E1159 Type 2 diabetes mellitus with other circulatory complications: Secondary | ICD-10-CM | POA: Diagnosis not present

## 2024-06-04 DIAGNOSIS — F5101 Primary insomnia: Secondary | ICD-10-CM

## 2024-06-04 DIAGNOSIS — E119 Type 2 diabetes mellitus without complications: Secondary | ICD-10-CM | POA: Insufficient documentation

## 2024-06-04 DIAGNOSIS — I152 Hypertension secondary to endocrine disorders: Secondary | ICD-10-CM

## 2024-06-04 DIAGNOSIS — N521 Erectile dysfunction due to diseases classified elsewhere: Secondary | ICD-10-CM

## 2024-06-04 DIAGNOSIS — E785 Hyperlipidemia, unspecified: Secondary | ICD-10-CM

## 2024-06-04 LAB — BAYER DCA HB A1C WAIVED: HB A1C (BAYER DCA - WAIVED): 9.6 % — ABNORMAL HIGH (ref 4.8–5.6)

## 2024-06-04 MED ORDER — SILDENAFIL CITRATE 100 MG PO TABS: 50.0000 mg | ORAL_TABLET | Freq: Every day | ORAL | 11 refills | Status: AC | PRN

## 2024-06-04 MED ORDER — PRAVASTATIN SODIUM 20 MG PO TABS: 20.0000 mg | ORAL_TABLET | Freq: Every day | ORAL | 3 refills | Status: AC

## 2024-06-04 MED ORDER — LEVOTHYROXINE SODIUM 150 MCG PO TABS: 150.0000 ug | ORAL_TABLET | Freq: Every day | ORAL | 2 refills | Status: DC

## 2024-06-04 MED ORDER — RYBELSUS 3 MG PO TABS: 3.0000 mg | ORAL_TABLET | Freq: Every day | ORAL | 3 refills | Status: AC

## 2024-06-04 MED ORDER — AMLODIPINE BESYLATE 5 MG PO TABS: 5.0000 mg | ORAL_TABLET | Freq: Every day | ORAL | 2 refills | Status: AC

## 2024-06-04 MED ORDER — BENAZEPRIL HCL 40 MG PO TABS
40.0000 mg | ORAL_TABLET | Freq: Every day | ORAL | 2 refills | Status: AC
Start: 2024-06-04 — End: ?

## 2024-06-04 NOTE — Progress Notes (Signed)
 Subjective:  Patient ID: Justin [name], male    DOB: 1970/08/01, 53 y.o.   MRN: [id number]  Patient Care Team: Justin Rock HERO, FNP as PCP - General (Family Medicine) Justin Dorn FALCON, MD as PCP - Cardiology (Cardiology) Justin [name], OD (Optometry) Justin [name], [place] (Optometry)   Chief Complaint:  Medical Management of Chronic Issues   HPI: Justin [name] is a 53 y.o. male presenting on 06/04/2024 for Medical Management of Chronic Issues   Justin [name] is a 53 year old male with diabetes and insomnia who presents with concerns about blood sugar fluctuations and sleep disturbances.  Glycemic variability - Significant fluctuations in blood glucose over the past 6-7 months - Episodes of hypoglycemia with blood glucose in the 40s, associated with feeling 'terrible' for several days - Hypoglycemic episodes managed with orange juice and candy - Pattern of fasting followed by large meals, resulting in postprandial spikes up to 300 and subsequent rapid drops - Average blood glucose of 220 on Libre continuous glucose monitor - Discontinued Trulicity  due to adverse effects, with subsequent weight gain of 10-15 pounds - Currently taking 40 units of Lantus  and Jardiance  for diabetes management  Sleep disturbance and fatigue - Chronic insomnia, with lifelong difficulty sleeping - Typically sleeps 2 hours before awakening and is unable to return to sleep - Lies awake after early morning awakening - Tried melatonin without benefit - Hesitant to take medications for sleep - Persistent fatigue attributed to sleep disturbance  Thyroid  hormone replacement - Taking 150 mcg of Synthroid  (levothyroxine ) for thyroid  management  Dyslipidemia medication intolerance - Not taking pravastatin  for cholesterol management due to concerns about side effects, influenced by adverse effects experienced by his father  Headache - Experiences random headaches  Cardiovascular and peripheral symptoms -  No chest pain - No leg swelling  Ophthalmologic surveillance - Undergoes regular eye exams at the beginning of each year          Relevant past medical, surgical, family, and social history reviewed and updated as indicated.  Allergies and medications reviewed and updated. Data reviewed: Chart in Epic.   Past Medical History:  Diagnosis Date   Diabetes mellitus (HCC)    Type 2 x 9 yrs.   Hyperlipidemia    Hypertension    x 5 yrs    Past Surgical History:  Procedure Laterality Date    rt knee arthroscopy     BIOPSY  03/21/2020   Procedure: BIOPSY;  Surgeon: Justin Angelia Sieving, MD;  Location: [facility];  Service: Gastroenterology;;   COLONOSCOPY  04/10/2012   Procedure: COLONOSCOPY;  Surgeon: Justin RAYMOND Rivet, MD;  Location: [facility];  Service: Endoscopy;  Laterality: N/A;  1200   COLONOSCOPY WITH PROPOFOL  N/A 03/21/2020   Procedure: COLONOSCOPY WITH PROPOFOL ;  Surgeon: Justin Angelia Sieving, MD;  Location: [facility];  Service: Gastroenterology;  Laterality: N/A;  10:00   POLYPECTOMY  03/21/2020   Procedure: POLYPECTOMY;  Surgeon: Justin Angelia Sieving, MD;  Location: [facility];  Service: Gastroenterology;;    Social History   Socioeconomic History   Marital status: Married    Spouse name: Not on file   Number of children: Not on file   Years of education: Not on file   Highest education level: Bachelor's degree (e.g., BA, AB, BS)  Occupational History   Not on file  Tobacco Use   Smoking status: Every Day   Smokeless tobacco: Never   Tobacco comments:  Cigar a day  Vaping Use   Vaping status: Never Used  Substance and Sexual Activity   Alcohol use: Yes    Comment: 6 pack a week   Drug use: No   Sexual activity: Not on file  Other Topics Concern   Not on file  Social History Narrative   Married for 24 yrs.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 IT for [employer]   Social Drivers of [name]   Financial Resource Strain: Low Risk  (10/08/2023)    Overall Financial Resource Strain (CARDIA)    Difficulty of Paying Living Expenses: Not very hard  Food Insecurity: No Food Insecurity (10/08/2023)   Hunger Vital Sign    Worried About Running Out of Food in the Last Year: Never true    Ran Out of Food in the Last Year: Never true  Transportation Needs: No Transportation Needs (10/08/2023)   PRAPARE - Administrator, Civil Service (Medical): No    Lack of Transportation (Non-Medical): No  Physical Activity: Insufficiently Active (10/08/2023)   Exercise Vital Sign    Days of Exercise per Week: 3 days    Minutes of Exercise per Session: 40 min  Stress: No Stress Concern Present (10/08/2023)   Justin [name] - Occupational Stress Questionnaire    Feeling of Stress : Not at all  Social Connections: Moderately Integrated (10/08/2023)   Social Connection and Isolation Panel    Frequency of Communication with Friends and Family: More than three times a week    Frequency of Social Gatherings with Friends and Family: More than three times a week    Attends Religious Services: 1 to 4 times per year    Active Member of [employer] or Organizations: No    Attends Banker Meetings: Not on file    Marital Status: Married  Intimate Partner Violence: Not on file    Outpatient Encounter Medications as of 06/04/2024  Medication Sig   aspirin EC 81 MG tablet Take 81 mg by mouth daily. Swallow whole.   BD PEN NEEDLE NANO 2ND GEN 32G X 4 MM MISC USE WITH INSULIN  DAILY DX E11.65   Continuous Glucose Receiver (FREESTYLE LIBRE 3 READER) DEVI 1 Device by Does not apply route 4 (four) times daily.   Continuous Glucose Sensor (FREESTYLE LIBRE 3 PLUS SENSOR) MISC CHANGE SENSOR EVERY 15 DAYS. TEST 4X DAILY BLOOD SUGAR CHECKS, ON INSULIN    empagliflozin  (JARDIANCE ) 25 MG TABS tablet Take 1 tablet (25 mg total) by mouth daily.   insulin  glargine (LANTUS  SOLOSTAR) 100 UNIT/ML Solostar Pen Inject 32-40 Units into the skin  at bedtime. Start with 32u at bedtime. Increase by 1 unit every TWO days if MORNING FASTING sugar above 150.   Semaglutide (RYBELSUS) 3 MG TABS Take 1 tablet (3 mg total) by mouth daily.   [DISCONTINUED] sildenafil  (VIAGRA ) 100 MG tablet Take 0.5-1 tablets (50-100 mg total) by mouth daily as needed for erectile dysfunction.   amLODipine  (NORVASC ) 5 MG tablet Take 1 tablet (5 mg total) by mouth daily.   benazepril  (LOTENSIN ) 40 MG tablet Take 1 tablet (40 mg total) by mouth daily.   levothyroxine  (SYNTHROID ) 150 MCG tablet Take 1 tablet (150 mcg total) by mouth daily before breakfast.   pravastatin  (PRAVACHOL ) 20 MG tablet Take 1 tablet (20 mg total) by mouth daily.   sildenafil  (VIAGRA ) 100 MG tablet Take 0.5-1 tablets (50-100 mg total) by mouth daily as needed for erectile dysfunction.   [DISCONTINUED] amLODipine  (NORVASC ) 5 MG tablet  Take 1 tablet (5 mg total) by mouth daily. **NEEDS TO BE SEEN BEFORE NEXT REFILL**   [DISCONTINUED] benazepril  (LOTENSIN ) 40 MG tablet Take 1 tablet (40 mg total) by mouth daily. **NEEDS TO BE SEEN BEFORE NEXT REFILL**   [DISCONTINUED] levothyroxine  (SYNTHROID ) 150 MCG tablet Take 1 tablet (150 mcg total) by mouth daily before breakfast.   [DISCONTINUED] pravastatin  (PRAVACHOL ) 20 MG tablet TAKE 1 TABLET (20 MG TOTAL) BY MOUTH DAILY. **NEEDS TO BE SEEN BEFORE NEXT REFILL**   No facility-administered encounter medications on file as of 06/04/2024.    Allergies  Allergen Reactions   Codeine     Hives, itching     Pertinent ROS per HPI, otherwise unremarkable      Objective:  BP 130/76   Pulse 97   Temp 98 F (36.7 C)   Ht 5' 5 (1.651 m)   Wt 161 lb (73 kg)   SpO2 95%   BMI 26.79 kg/m    Wt Readings from Last 3 Encounters:  06/04/24 161 lb (73 kg)  10/09/23 159 lb (72.1 kg)  07/09/23 155 lb (70.3 kg)    Physical Exam Vitals and nursing note reviewed.  Constitutional:      General: He is not in acute distress.    Appearance: Normal  appearance. He is not ill-appearing, toxic-appearing or diaphoretic.  HENT:     Head: Normocephalic and atraumatic.     Nose: Nose normal.     Mouth/Throat:     Mouth: Mucous membranes are moist.  Eyes:     Conjunctiva/sclera: Conjunctivae normal.     Pupils: Pupils are equal, round, and reactive to light.  Cardiovascular:     Rate and Rhythm: Normal rate and regular rhythm.     Heart sounds: Normal heart sounds.  Pulmonary:     Effort: Pulmonary effort is normal.     Breath sounds: Normal breath sounds.  Musculoskeletal:     Cervical back: Neck supple.     Right lower leg: No edema.     Left lower leg: No edema.  Skin:    General: Skin is warm and dry.     Capillary Refill: Capillary refill takes less than 2 seconds.  Neurological:     General: No focal deficit present.     Mental Status: He is alert and oriented to person, place, and time.  Psychiatric:        Mood and Affect: Mood normal.        Behavior: Behavior normal.        Thought Content: Thought content normal.        Judgment: Judgment normal.     Results for orders placed or performed in visit on 10/09/23  [facility] DIABETES EYE EXAM   Collection Time: 08/19/23  9:15 AM  Result Value Ref Range   HM Diabetic Eye Exam No Retinopathy No Retinopathy       Pertinent labs & imaging results that were available during my care of the patient were reviewed by me and considered in my medical decision making.  Assessment & Plan:  Davarious was seen today for medical management of chronic issues.  Diagnoses and all orders for this visit:  Type 2 diabetes mellitus with other specified complication, with long-term current use of insulin  (HCC) -     TSH -     T4, free -     Bayer DCA Hb A1c Waived -     pravastatin  (PRAVACHOL ) 20 MG tablet; Take 1 tablet (20 mg total)  by mouth daily. -     benazepril  (LOTENSIN ) 40 MG tablet; Take 1 tablet (40 mg total) by mouth daily. -     Semaglutide (RYBELSUS) 3 MG TABS; Take 1 tablet (3 mg  total) by mouth daily.  Hyperlipidemia associated with type 2 diabetes mellitus (HCC) -     TSH -     T4, free -     Bayer DCA Hb A1c Waived -     pravastatin  (PRAVACHOL ) 20 MG tablet; Take 1 tablet (20 mg total) by mouth daily.  Hypothyroidism (acquired) -     TSH -     T4, free -     levothyroxine  (SYNTHROID ) 150 MCG tablet; Take 1 tablet (150 mcg total) by mouth daily before breakfast.  Hypertension associated with diabetes (HCC) -     TSH -     T4, free -     Bayer DCA Hb A1c Waived -     benazepril  (LOTENSIN ) 40 MG tablet; Take 1 tablet (40 mg total) by mouth daily. -     amLODipine  (NORVASC ) 5 MG tablet; Take 1 tablet (5 mg total) by mouth daily.  Primary insomnia -     TSH -     T4, free  Erectile dysfunction due to type 2 diabetes mellitus (HCC) -     sildenafil  (VIAGRA ) 100 MG tablet; Take 0.5-1 tablets (50-100 mg total) by mouth daily as needed for erectile dysfunction.      Type 2 diabetes mellitus with hypoglycemia and poor glycemic control Type 2 diabetes with recent episodes of hypoglycemia, lowest reading in the 40s, and poor glycemic control with A1c of 9.6, slightly worse than previous 9.1. Hypoglycemia likely related to fasting and large meals. Discontinued Trulicity  due to adverse effects, resulting in weight gain. Will start Rybelsus 3 mg daily. 30 day sample provided. Pt educated to take on an empty stomach with 4 oz of water  and wait 30 minutes prior to eating or drinking anything else.  - Continue monitoring blood glucose with Libre. - Provided samples of Rybelsus. - Adjust diet to avoid large meals and consider small frequent meals. - Monitor for triggers of hypoglycemia and adjust diet accordingly.  Insomnia Chronic insomnia with difficulty staying asleep. Previous trial of melatonin was ineffective. No current use of sleep medications. - Trial long-acting melatonin or magnesium complex for sleep support.  Hypothyroidism Managed with levothyroxine  150  mcg. Reports fatigue, possibly related to thyroid  function. Thyroid  dysfunction can affect blood sugar control. - Ordered thyroid  function tests to assess current thyroid  status.  Hyperlipidemia Management with pravastatin . He is hesitant to take pravastatin  due to concerns about side effects, influenced by family experiences. Pravastatin  is chosen for its lower side effect profile compared to other statins. - Trial pravastatin  three times a week to assess tolerance and effectiveness.  Hypertension Recent elevated blood pressure readings. Reports occasional headaches but no chest pain or leg swelling. Magnesium supplementation may aid in blood pressure management. - Consider magnesium supplementation for blood pressure support.          Continue all other maintenance medications.  Follow up plan: Return in about 3 months (around 09/04/2024), or if symptoms worsen or fail to improve, for DM.   Continue healthy lifestyle choices, including diet (rich in fruits, vegetables, and lean proteins, and low in salt and simple carbohydrates) and exercise (at least 30 minutes of moderate physical activity daily).  Educational handout given for DM  The above assessment and management plan was discussed with  the patient. The patient verbalized understanding of and has agreed to the management plan. Patient is aware to call the clinic if they develop any new symptoms or if symptoms persist or worsen. Patient is aware when to return to the clinic for a follow-up visit. Patient educated on when it is appropriate to go to the emergency department.   Rosaline Bruns, FNP-C [facility] [phone]

## 2024-06-04 NOTE — Patient Instructions (Addendum)

## 2024-06-05 LAB — TSH: TSH: 0.487 u[IU]/mL (ref 0.450–4.500)

## 2024-06-05 LAB — T4, FREE: Free T4: 1.59 ng/dL (ref 0.82–1.77)

## 2024-06-06 MED ORDER — LEVOTHYROXINE SODIUM 150 MCG PO TABS: 150.0000 ug | ORAL_TABLET | Freq: Every day | ORAL | 2 refills | Status: AC

## 2024-07-31 ENCOUNTER — Other Ambulatory Visit: Payer: Self-pay | Admitting: Family Medicine

## 2024-07-31 DIAGNOSIS — E1165 Type 2 diabetes mellitus with hyperglycemia: Secondary | ICD-10-CM

## 2024-09-09 ENCOUNTER — Ambulatory Visit: Admitting: Family Medicine
